# Patient Record
Sex: Female | Born: 1980 | Race: White | Hispanic: No | Marital: Married | Smoking: Never smoker
Health system: Southern US, Community
[De-identification: ages and names within clinical notes are randomized; demographics above are authoritative.]

## PROBLEM LIST (undated history)

## (undated) DIAGNOSIS — E538 Deficiency of other specified B group vitamins: Secondary | ICD-10-CM

## (undated) DIAGNOSIS — Z87448 Personal history of other diseases of urinary system: Secondary | ICD-10-CM

## (undated) DIAGNOSIS — E559 Vitamin D deficiency, unspecified: Secondary | ICD-10-CM

## (undated) DIAGNOSIS — R519 Headache, unspecified: Secondary | ICD-10-CM

## (undated) DIAGNOSIS — Z87442 Personal history of urinary calculi: Secondary | ICD-10-CM

## (undated) HISTORY — PX: WISDOM TOOTH EXTRACTION: SHX21

---

## 2003-07-17 ENCOUNTER — Inpatient Hospital Stay (HOSPITAL_COMMUNITY): Admission: AD | Admit: 2003-07-17 | Discharge: 2003-07-22 | Payer: Self-pay | Admitting: Obstetrics and Gynecology

## 2003-07-18 ENCOUNTER — Encounter (INDEPENDENT_AMBULATORY_CARE_PROVIDER_SITE_OTHER): Payer: Self-pay | Admitting: Specialist

## 2003-07-23 ENCOUNTER — Encounter: Admission: RE | Admit: 2003-07-23 | Discharge: 2003-08-22 | Payer: Self-pay | Admitting: Obstetrics and Gynecology

## 2008-02-08 ENCOUNTER — Inpatient Hospital Stay (HOSPITAL_COMMUNITY): Admission: AD | Admit: 2008-02-08 | Discharge: 2008-02-08 | Payer: Self-pay | Admitting: Obstetrics and Gynecology

## 2008-03-10 ENCOUNTER — Ambulatory Visit (HOSPITAL_COMMUNITY): Admission: RE | Admit: 2008-03-10 | Discharge: 2008-03-10 | Payer: Self-pay | Admitting: Obstetrics and Gynecology

## 2008-03-15 ENCOUNTER — Ambulatory Visit (HOSPITAL_COMMUNITY): Admission: RE | Admit: 2008-03-15 | Discharge: 2008-03-15 | Payer: Self-pay | Admitting: Obstetrics and Gynecology

## 2008-03-29 ENCOUNTER — Ambulatory Visit (HOSPITAL_COMMUNITY): Admission: RE | Admit: 2008-03-29 | Discharge: 2008-03-29 | Payer: Self-pay | Admitting: Obstetrics and Gynecology

## 2008-04-26 ENCOUNTER — Ambulatory Visit (HOSPITAL_COMMUNITY): Admission: RE | Admit: 2008-04-26 | Discharge: 2008-04-26 | Payer: Self-pay | Admitting: Obstetrics and Gynecology

## 2008-05-24 ENCOUNTER — Ambulatory Visit (HOSPITAL_COMMUNITY): Admission: RE | Admit: 2008-05-24 | Discharge: 2008-05-24 | Payer: Self-pay | Admitting: Obstetrics and Gynecology

## 2008-08-19 ENCOUNTER — Inpatient Hospital Stay (HOSPITAL_COMMUNITY): Admission: AD | Admit: 2008-08-19 | Discharge: 2008-08-19 | Payer: Self-pay | Admitting: Obstetrics and Gynecology

## 2008-09-16 ENCOUNTER — Inpatient Hospital Stay (HOSPITAL_COMMUNITY): Admission: RE | Admit: 2008-09-16 | Discharge: 2008-09-19 | Payer: Self-pay | Admitting: Obstetrics and Gynecology

## 2009-06-03 IMAGING — US US OB DETAIL+14 WK
1 series · 18 of 28 positions shown · non-contrast
Comparison: none

OBSTETRICAL ULTRASOUND:
 This ultrasound was performed in The [HOSPITAL], and the [facility] report will be stored to [REDACTED] PACS.

[Series 1: us ob detail+14 wk · 55 acquisitions, 18 frames shown]
[im 1/55]
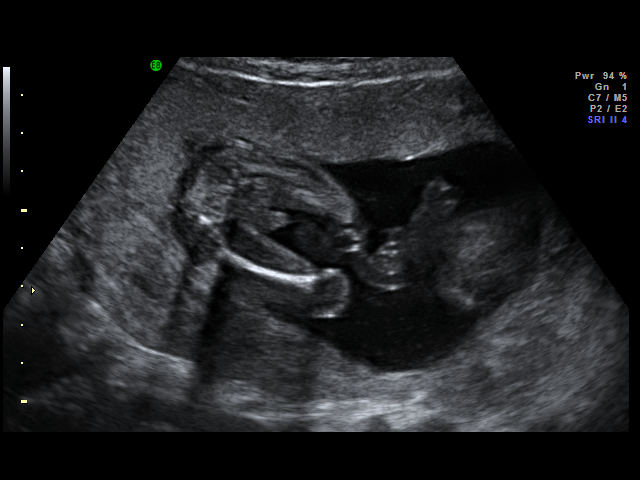
[im 5/55]
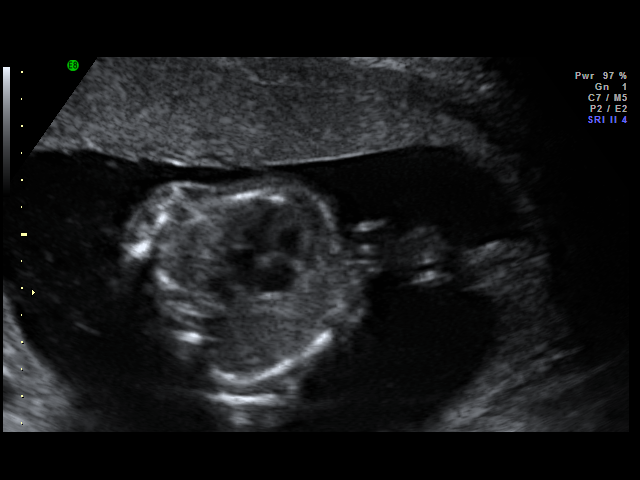
[im 7/55]
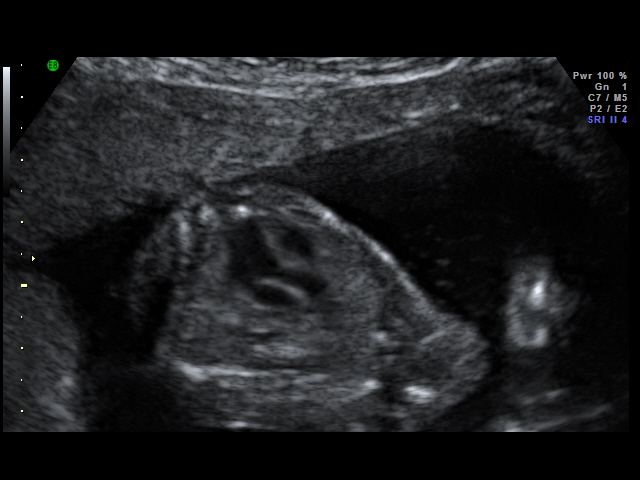
[im 11/55]
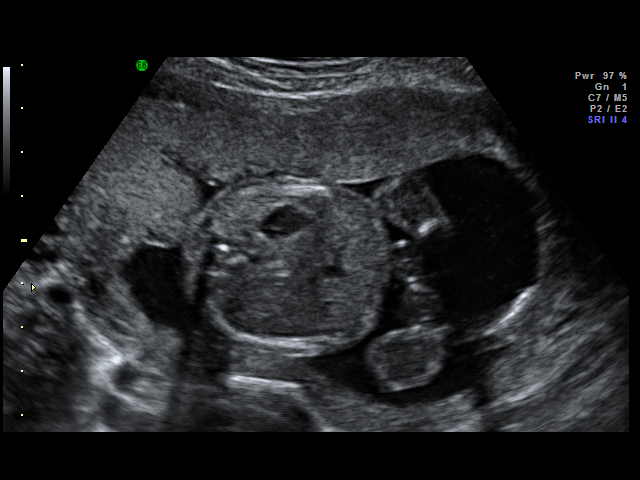
[im 15/55]
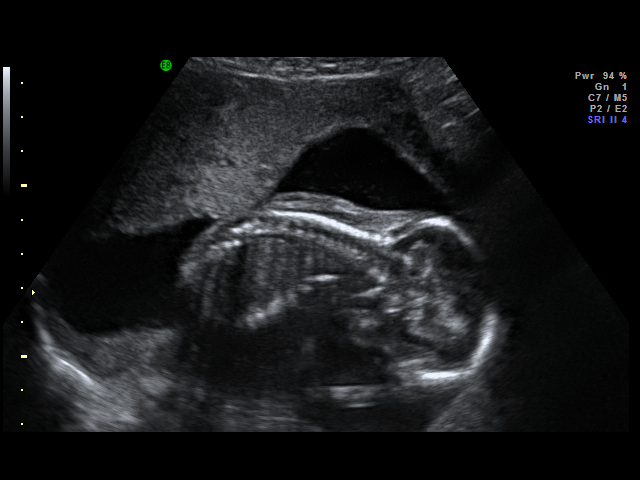
[im 17/55]
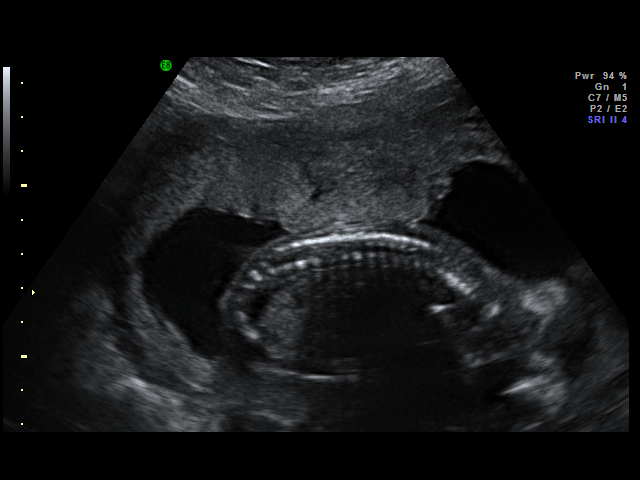
[im 21/55]
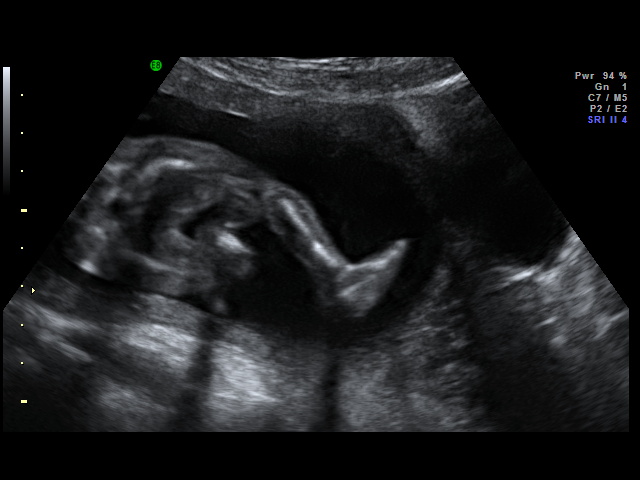
[im 23/55]
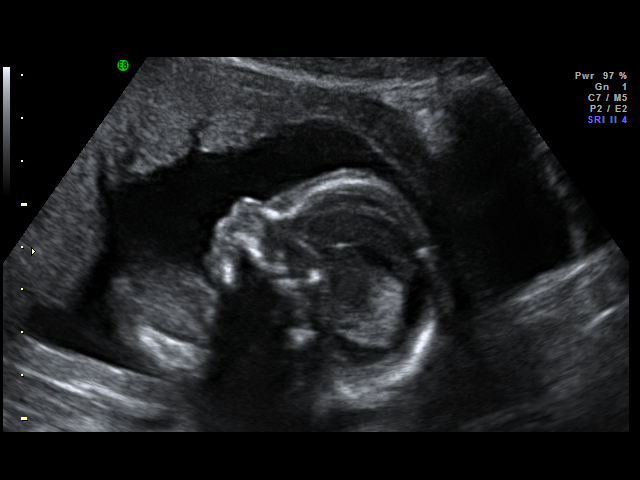
[im 27/55]
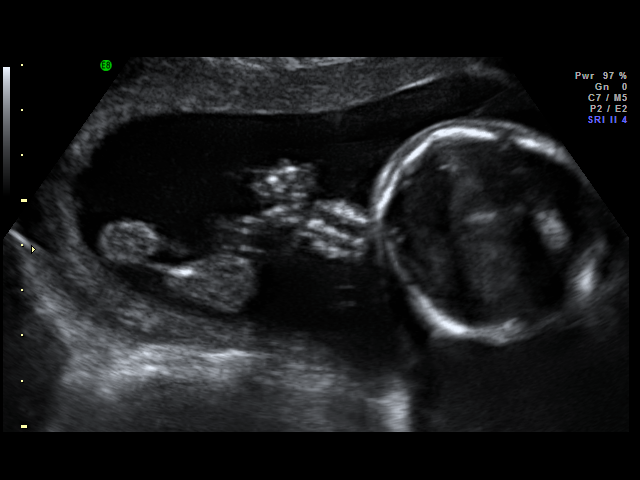
[im 29/55]
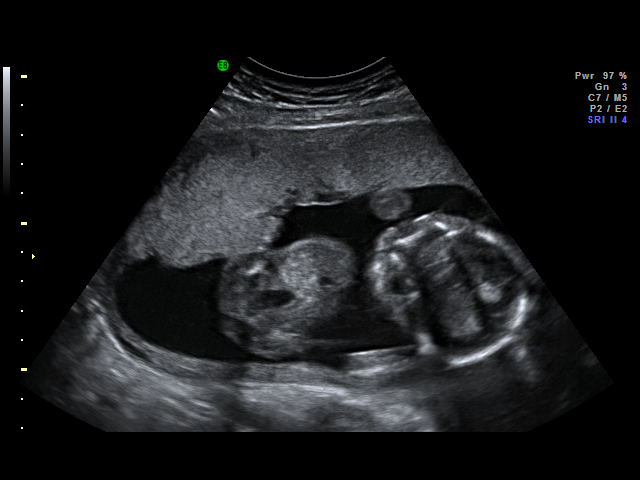
[im 33/55]
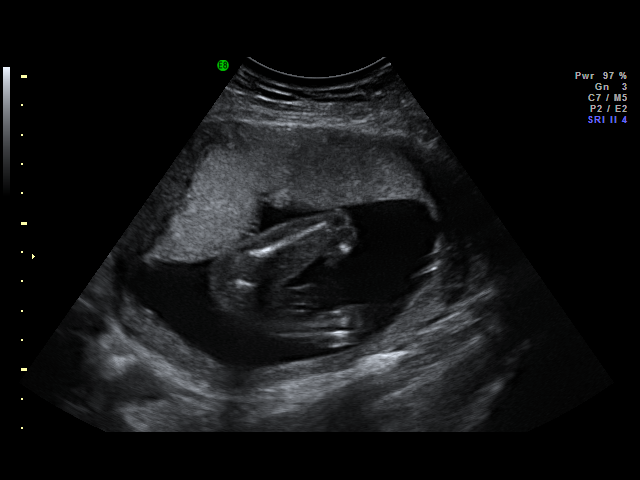
[im 35/55]
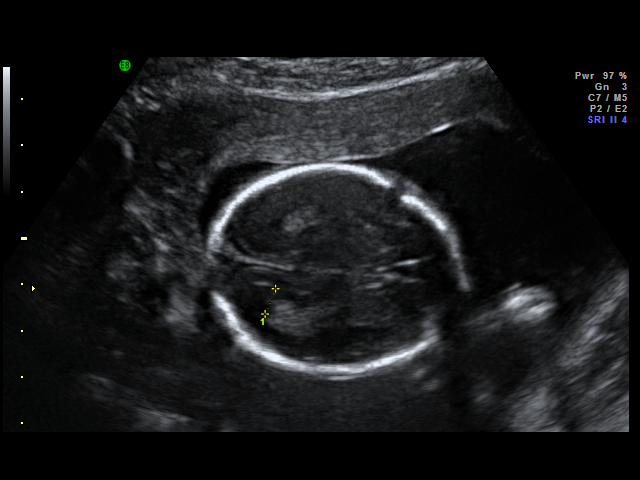
[im 39/55]
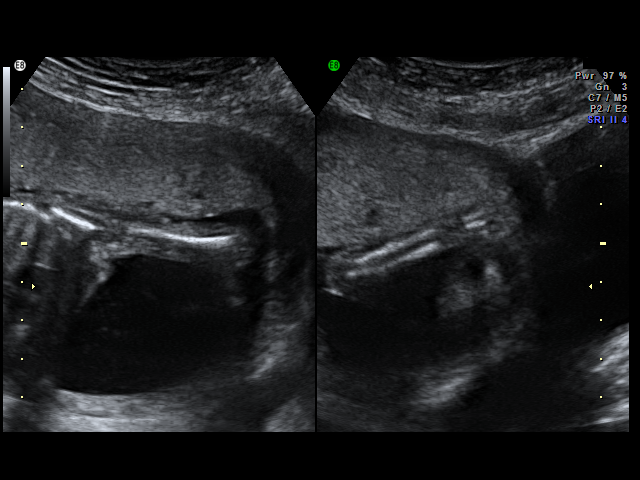
[im 43/55]
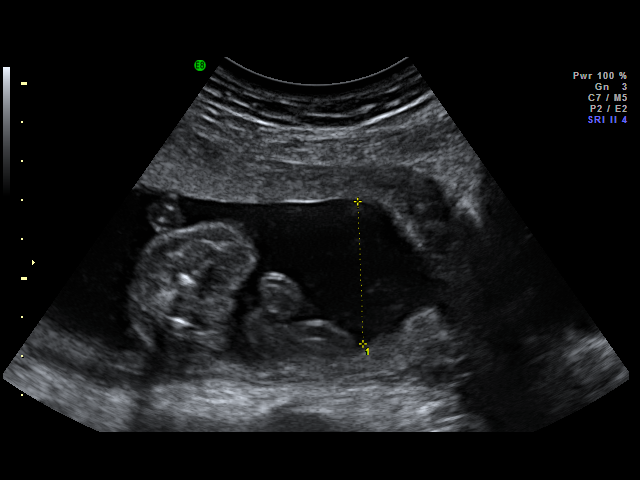
[im 45/55]
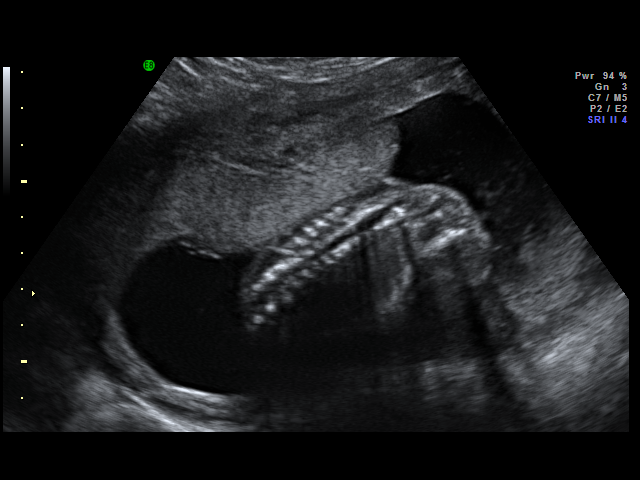
[im 49/55]
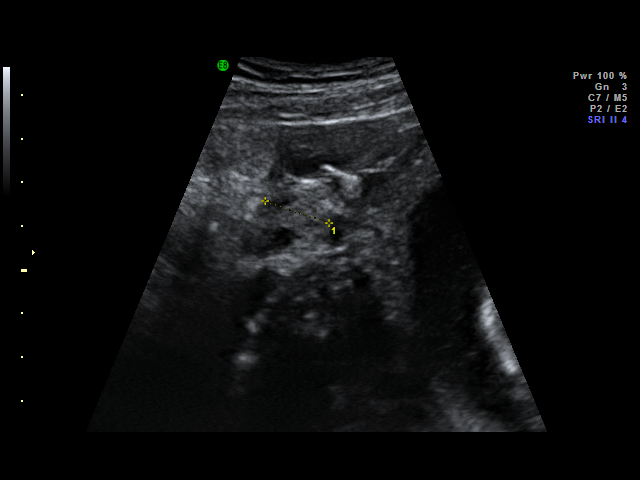
[im 51/55]
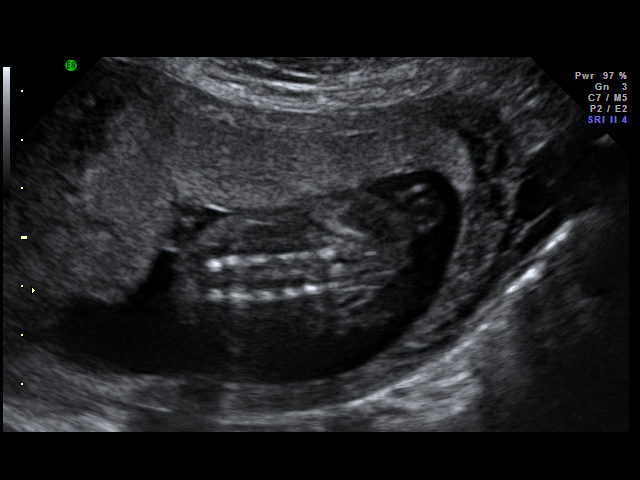
[im 55/55]
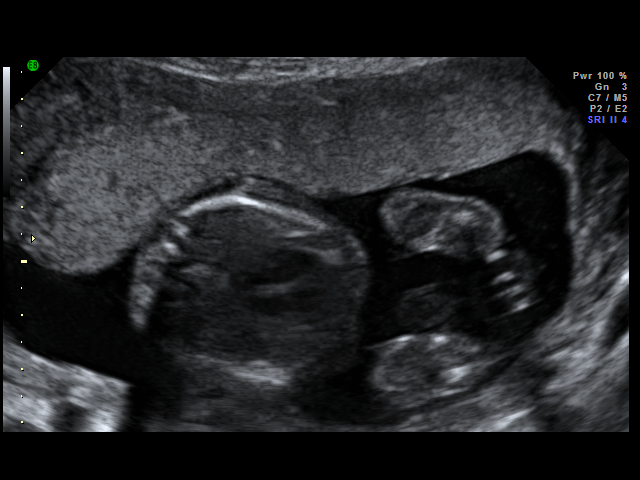

[18 of 28 positions shown; findings below may reference images not displayed]

IMPRESSION: [facility] has also been faxed to the ordering physician.

## 2010-05-27 ENCOUNTER — Encounter: Payer: Self-pay | Admitting: Obstetrics and Gynecology

## 2010-08-14 LAB — CBC
HCT: 26.6 % — ABNORMAL LOW (ref 36.0–46.0)
MCV: 93.3 fL (ref 78.0–100.0)
MCV: 94 fL (ref 78.0–100.0)
Platelets: 158 10*3/uL (ref 150–400)
Platelets: 208 10*3/uL (ref 150–400)
RBC: 2.83 MIL/uL — ABNORMAL LOW (ref 3.87–5.11)
RBC: 3.92 MIL/uL (ref 3.87–5.11)
WBC: 10.8 10*3/uL — ABNORMAL HIGH (ref 4.0–10.5)
WBC: 7.8 10*3/uL (ref 4.0–10.5)

## 2010-08-14 LAB — RPR: RPR Ser Ql: NONREACTIVE

## 2010-09-18 NOTE — Op Note (Signed)
NAMEJAHARI, [name]                  ACCOUNT NO.:  [id number]   MEDICAL RECORD NO.:  [id number]          PATIENT TYPE:  INP   LOCATION:  9143                          FACILITY:  [facility]   PHYSICIAN:  Huel Cote, M.D. DATE OF BIRTH:  1981-04-22   DATE OF PROCEDURE:  09/16/2008  DATE OF DISCHARGE:                               OPERATIVE REPORT   PREOPERATIVE DIAGNOSES:  1. Term pregnancy at 39 weeks delivered.  2. Prior cesarean section, declines trial of labor.   POSTOPERATIVE DIAGNOSES:  1. Term pregnancy at 39 weeks delivered.  2. Prior cesarean section, declines trial of labor.   PROCEDURE:  Repeat low transverse cesarean section.   SURGEON:  Huel Cote, MD   ASSISTANT:  Zenaida Niece, MD   ANESTHESIA:  Spinal.   FINDINGS:  This is a vigorous female infant in the vertex presentation.  Apgars were 8 and 9, weight was 7 pounds 13 ounces.  Normal uterus,  tubes, and ovaries were noted.   SPECIMEN:  Placenta was sent to L and D.   ESTIMATED BLOOD LOSS:  800 mL.   URINE OUTPUT:  100 mL clear urine.   IV FLUIDS:  2400 mL LR.   PROCEDURE:  The patient was taken to the operating room, where spinal  anesthesia was obtained without difficulty.  She was then prepped and  draped in the normal sterile fashion in the dorsal supine position with  a leftward tilt with a Foley catheter in place.  A Pfannenstiel skin  incision was then made through a preexisting scar and carried through to  the underlying layer of fascia by sharp dissection and Bovie cautery.  Fascia was then nicked in the midline and the incision was extended  laterally with Mayo scissors.  The inferior aspect was grasped with  Kocher clamps, elevated, and dissected off the underlying rectus  muscles.  Superior aspect was likewise dissected off the rectus muscles.  These were separated in the midline and the peritoneum entered bluntly.  This incision was then extended both superiorly and inferiorly with  careful attention to avoid both bowel and bladder.  The Alexis wound  retractor was then placed within the incision and the lower uterine  segment exposed nicely.  This was then incised in a transverse fashion  and the cavity itself entered bluntly with clear fluid noted.  The  infant's head was then delivered atraumatically.  The nose and mouth  were bulb suctioned and the remainder of the infant delivered without  difficulty.  Cord was clamped and cut and the infant was handed to the  awaiting pediatricians.  The cord blood was then obtained and the  placenta delivered manually from the uterus which was cleared of all  clots and debris with moist lap sponge.  The uterus was then closed in 2  layers, the first with running lock layer of 1-0 chromic, the second an  imbricating layer of same suture.  The incision appeared hemostatic.  Tubes and ovaries were inspected and found to be normal.  The gutters  were cleared of all clots and debris.  Again the incision was inspected  and found to be hemostatic.  The peritoneum and rectus muscles were then  reapproximated with several interrupted sutures of 0 Vicryl in mattress  sutures.  The fascia was then closed in a running fashion with 0 Vicryl  and the skin was closed with staples.  Sponge, lap, and needle counts  were correct x2, and the patient was taken to the recovery room in  stable condition.      Huel Cote, M.D.  Electronically Signed     KR/MEDQ  D:  09/16/2008  T:  09/17/2008  Job:  [id number]

## 2010-09-18 NOTE — H&P (Signed)
[name], [name]                  ACCOUNT NO.:  [id number]   MEDICAL RECORD NO.:  [id number]          PATIENT TYPE:  INP   LOCATION:                                [facility]  [facility]   PHYSICIAN:  Huel Cote, M.D. DATE OF BIRTH:  1981/04/08   DATE OF ADMISSION:  09/16/2008  DATE OF DISCHARGE:                              HISTORY & PHYSICAL   The patient is a 30 year old G2, P0-1-0-1, who is coming in for a  scheduled repeat C-section at 39 weeks, given a prior C-section and a  decline in trial of labor.  The patient's prenatal care had been  significant for a very early finding of possible cystic hygroma and  developing fetal hydrops for which she was extensively followed up by  Maternal Fetal Medicine.  The hydrops actually resolved and the patient  was tested for several possible viral illnesses.  She did have an  equivocal coxsackie virus check, which could have been responsible for  the early nonimmune hydrops, which did resolve in the baby.  The baby  was followed by subsequent frequent scanning and growth scanning, and  the baby appeared to develop normally and had a normal fetal  echocardiogram as well.  Due to consistent normal growth, her growth  ultrasounds were discontinued after 32 weeks and plan was made for  delivery via repeat C-section.  Other than this rather complicated  prenatal course, the patient is a healthy female and had only the  previous C-section as a risk factor for the pregnancy.   PRENATAL LABORATORY DATA:  O positive, antibody negative, RPR  nonreactive, rubella immune, hepatitis B surface antigen negative, HIV  negative, GC negative, and Chlamydia negative.  First trimester screen  did overall return negative for chromosomal defects, even though the  ultrasound was initially abnormal.  One-hour Glucola is 117.  TORCH  titers performed were negative, except for the equivocal coxsackie virus  titer.  Group B strep was negative.   PAST OBSTETRICAL  HISTORY:  In 2005, the patient had a primary C-section  for a breech baby at 35 plus weeks measuring 6 pounds 13 ounces.   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  She otherwise is  healthy female.   PAST SURGICAL HISTORY:  History of wisdom teeth removal only.   PAST GYN HISTORY:  No abnormal Pap smears.   ALLERGIES:  None known.   MEDICATIONS:  None except for prenatal vitamins.   PHYSICAL EXAMINATION:  VITAL SIGNS:  On admission, the patient's weight  is 157 pounds and blood pressure 100/60.  CARDIAC:  Regular rate and rhythm.  LUNGS:  Clear.  ABDOMEN:  Soft, gravid, and nontender.  Cervix on last check was 51 plus  and -2 station.   It was discussed with the patient the risks and benefits of cesarean  section including bleeding, infection, and possible damage to bowel and  bladder.  The patient understands these risks and desired to proceed  with cesarean section and forego any trial of labor.  We have scheduled  her for repeat C-section at the [facility] facility on Sep 16, 2008, and we will proceed with that plan, unless she labors prior.      Huel Cote, M.D.  Electronically Signed     KR/MEDQ  D:  09/14/2008  T:  09/15/2008  Job:  [id number]

## 2010-09-18 NOTE — Discharge Summary (Signed)
NAMEYAMELI, [name]                  ACCOUNT NO.:  [id number]   MEDICAL RECORD NO.:  [id number]          PATIENT TYPE:  INP   LOCATION:  9143                          [facility]   PHYSICIAN:  Malachi Pro. Ambrose Mantle, M.D. DATE OF BIRTH:  1980-11-28   DATE OF ADMISSION:  09/16/2008  DATE OF DISCHARGE:  09/18/2008                               DISCHARGE SUMMARY   A 30 year old white female para 0-1-0-1, gravida 2 scheduled for a  repeat C-section.  The patient's prenatal care had been significant for  very early finding of possible cystic hygroma and developing fetal  hydrops.  She was evaluated by Maternal Fetal Medicine.  The hydrops  resolved.  The patient was tested for several viral illnesses.  She had  an equivocal coxsackievirus check.  The baby was followed by frequent  scanning and growth scanning.  The baby appeared to develop normally,  had a normal fetal echocardiogram.  The patient's blood group and type  was O positive with a negative antibody, RPR nonreactive, rubella  immune, hepatitis B surface antigen negative, HIV negative, GC and  chlamydia negative.  First trimester screen was negative for chromosomal  defects, even though the ultrasound was initially abnormal.  One-hour  Glucola was 117.  TORCH titers were negative except for the equivocal  coxsackievirus titer.  Group B strep negative.  The patient was  admitted, had a repeat C-section by Dr. Senaida Ores, at which time she  found a normal uterus, tubes, and ovaries.  She delivered a vigorous  female infant, 7 pounds 13 ounces with Apgars of 8 at 1-minute and 9 at 5  minutes.  Postoperatively, the patient did well.  Initially, she had no  urine output and the Foley catheter was changed the catheter and she had  1650 mL of urine in her bladder.  Subsequently, did quite well and was  discharged on the second postoperative day.  Initial hemoglobin was  12/06, hematocrit 36.6, white count 7,800, and platelet count 208,000.  RPR nonreactive.  Followup hemoglobin 9.3 and hematocrit 26.6.  At the  present time, the patient is ambulating well, tolerating a regular diet,  voiding well, and passing flatus.  Her incision looks clean and dry and  she is ready for discharge.   FINAL DIAGNOSIS:  Intrauterine pregnancy at 39 weeks' delivered vertex  by C-section.   OPERATION:  Low-transverse cervical C-section.   FINAL CONDITION:  Improved.   INSTRUCTIONS:  Our regular discharge instruction booklet.  The patient  was given prescriptions for Motrin 600 mg 30 tablets 1 every 6 hours as  needed for pain and Darvocet-N 100 12 tablets 1 every 4-6 hours as  needed for pain.  She is to return to the office in 2-3 days to have her  staples removed.      Malachi Pro. Ambrose Mantle, M.D.  Electronically Signed     TFH/MEDQ  D:  09/18/2008  T:  09/18/2008  Job:  [id number]

## 2010-09-21 NOTE — H&P (Signed)
Tamara, [name]                              ACCOUNT NO.:  [id number]   MEDICAL RECORD NO.:  [id number]                   PATIENT TYPE:  INP   LOCATION:  9124                                 [facility]   PHYSICIAN:  Malachi Pro. Ambrose Mantle, M.D.              DATE OF BIRTH:  1980-05-20   DATE OF ADMISSION:  07/17/2003  DATE OF DISCHARGE:                                HISTORY & PHYSICAL   HISTORY OF PRESENT ILLNESS:  A 30 year old white married female para 0  gravida 1.  Last period November 04, 2002.  [facility] August 12, 2003 by dates and August 14, 2003 by ultrasound.  Admitted in labor with rupture of membranes and  frank breech presentation.  Blood group and type O positive with a negative  antibody, nonreactive serology, rubella immune, hepatitis B surface antigen  negative, HIV negative, GC and chlamydia negative, triple screen normal,  group B strep positive, one-hour Glucola 94.  The patient had vaginal  ultrasound on January 07, 2003:  Crown-rump length 2.05 cm; 8 weeks 5 days;  [facility] August 14, 2003.  Repeat ultrasound on March 16, 2003:  Average  gestational age [redacted] weeks 4 days; [facility] August 13, 2003.  Prenatal care was  uncomplicated except breech presentation.  At 10:15 p.m. on July 17, 2003  the patient had spontaneous rupture of membranes and 30 minutes later had  onset of contractions.  On admission the cervix was 4 cm dilated; frank  breech presentation was confirmed by ultrasound.   PAST MEDICAL HISTORY:  Allergies to CODEINE - caused vomiting.  No  operations.  Illnesses:  Cystitis.   FAMILY HISTORY:  Mother with high blood pressure, anxiety, and diabetes and  thyroid dysfunction.  Maternal grandfather with MI, high blood pressure,  CVA.  Paternal grandfather - MI.  Maternal uncle - Downs syndrome.   PHYSICAL EXAMINATION ON ADMISSION:  VITAL SIGNS:  Blood pressure 127/75,  temperature 98.2, pulse 107, respirations 18.  HEART:  Normal size and sounds, no murmurs.  LUNGS:  Clear  to P&A.  ABDOMEN:  Soft, nontender, near term-sized fundus.  Fetal heart tones  normal, no decelerations, adequate variability.  PELVIC:  Cervix 4 cm per the admitting nurse.   IMPRESSION:  1. Intrauterine pregnancy at 36-and-a-half weeks.  2. Rupture of membranes.  3. Breech presentation.   The patient is prepared for cesarean section.                                               Malachi Pro. Ambrose Mantle, M.D.    TFH/MEDQ  D:  07/18/2003  T:  07/18/2003  Job:  [id number]

## 2010-09-21 NOTE — Discharge Summary (Signed)
NAMELASHAY, OSBORNE                              ACCOUNT NO.:  [id number]   MEDICAL RECORD NO.:  [id number]                   PATIENT TYPE:  INP   LOCATION:  9124                                 [facility]   PHYSICIAN:  Malachi Pro. Ambrose Mantle, M.D.              DATE OF BIRTH:  09-06-1980   DATE OF ADMISSION:  07/17/2003  DATE OF DISCHARGE:  07/22/2003                                 DISCHARGE SUMMARY   HOSPITAL COURSE:  A 30 year old white female para 0 gravida 1; EDC July 12, 2003; admitted in labor with rupture of membranes and frank breech  presentation.  Blood group and type O positive with a negative antibody,  nonreactive serology, rubella immune, hepatitis B surface antigen negative,  HIV negative, GC and chlamydia negative, triple screen normal, group B strep  positive, one-hour Glucola 94.  The patient was admitted in labor with a  breech presentation.  She was taken to the operating room, underwent a low  transverse cervical C-section by Dr. Ambrose Mantle under spinal anesthesia with the  delivery of a female infant, 6 pounds 13 ounces, Apgars of 5 at one and 9 at  five minutes, arterial cord pH was 7.24.  Postpartum the patient did  extremely well.  I felt that she was a candidate for discharge on postpartum  day #2 but because of the baby's slightly poor feeding she wanted to stay  and she was kept until postoperative day #4 at which time the staples were  removed, strips were applied, and she was discharged.  She was ambulating  well without difficulty, tolerating a regular diet, voiding well without  difficulty, and had had a bowel movement.   LABORATORY DATA:  On admission, hemoglobin 10.6; hematocrit 31.5; white  count 8600; platelet count 287,000.  Follow-up hemoglobin 9.5.  RPR  nonreactive.   FINAL DIAGNOSIS:  Intrauterine pregnancy at 36-and-a-half weeks with  ruptured membranes and frank breech presentation, delivered by cesarean  section.   OPERATION:  Low transverse  cervical cesarean section.   FINAL CONDITION:  Improved.   INSTRUCTIONS:  Instructions include our regular discharge instruction  booklet.  The patient is given a prescription for Darvocet-N 100 #20 tablets  one q.4-6h. as needed for pain and is advised to return to the office in 10-  14 days for follow-up examination.                                               Malachi Pro. Ambrose Mantle, M.D.    TFH/MEDQ  D:  07/22/2003  T:  07/23/2003  Job:  [id number]

## 2010-09-21 NOTE — Op Note (Signed)
[name], [name]                              ACCOUNT NO.:  [id number]   MEDICAL RECORD NO.:  [id number]                   PATIENT TYPE:  INP   LOCATION:  9124                                 [facility]   PHYSICIAN:  Malachi Pro. Ambrose Mantle, M.D.              DATE OF BIRTH:  [birth year]   DATE OF PROCEDURE:  07/18/2003  DATE OF DISCHARGE:                                 OPERATIVE REPORT   PREOPERATIVE DIAGNOSES:  1. Intrauterine pregnancy at 36+ weeks.  2. Active labor.  3. Positive group B Streptococcus.  4. Breech presentation.   POSTOPERATIVE DIAGNOSES:  1. Intrauterine pregnancy at 36+ weeks.  2. Active labor.  3. Positive group B Streptococcus.  4. Breech presentation.   OPERATION:  Low transverse cervical C-section.  Operator:  Malachi Pro. Ambrose Mantle,  M.D.  Spinal anesthesia.  Female infant, 6 pounds 13 ounces, Apgars of 5 at  one and 9 at five minutes.  Arterial cord pH 7.24.  Blood loss about 1000  mL.   The patient was brought to the operating room and given a spinal anesthetic  by Dr. Tacy Dura.  She was placed in the left lateral tilt position.  Fetal  heart tones were confirmed normal.  The abdomen and urethra were prepped  with Betadine solution and Foley catheter was inserted to straight drain.  The abdomen was then draped as a sterile field.  Adequate anesthesia was  confirmed.  A transverse incision was made and carried in layers through the  skin and subcutaneous tissue and fascia.  The fascia was then separated from  the rectus muscles superiorly and inferiorly.  The rectus muscle was split  in the midline.  The peritoneum was opened vertically.  The lower uterine  segment was inspected, found to be well developed.  An incision was made  into the lower uterine segment peritoneum and extended laterally and pushed  inferiorly, pulling the bladder with it.  An incision was then made into the  lower uterine segment, initially going through the superficial myometrium  with  the knife and then with my finger into the amniotic sac where we  obtained clear fluid along with blood.  I then enlarged the incision by  pulling superiorly and inferiorly.  The baby was frank breech.  The baby's  buttocks delivered easily, as did the body.  I delivered both arms without  difficulty.  The initial part of the baby's head delivered all right.  Then,  using the Mariceau-Smellie-Beit maneuver I got the baby to deliver to the  forehead, but then it seemed to have difficulty coming all out, so I asked  the assistant to hold the baby's legs up and I was able to reach under the  baby's head and lift the baby out.  This process probably took close to a  minute.  In the meantime, the baby lost a little bit of tone.  I clamped the  cord, gave the infant to Dr. Ruben Gottron who was in attendance.  He  assigned the baby Apgars of 5 at one minute and 9 at five minutes.  I did an  arterial cord pH that was 7.24.  The placenta was removed.  The inside of  the uterus was found to be without problems.  The tubes and ovaries and  uterus appeared normal.  The uterus was closed with 2 layers using running  locked suture of 0 Vicryl in the first layer, nonlocking suture of the same  material on the second layer.  A couple of sutures of 3-0 Vicryl were  required to complete hemostasis as well as some use of the electrical  current.  Liberal irrigation confirmed hemostasis at this point.  The rectus  muscle was reapproximated along with some of the peritoneum with interrupted  sutures of 0 Vicryl.  The fascia was then closed with two running sutures of  0 Vicryl, subcu with a running 3-0 Vicryl, and the skin was closed with  automatic staples.  The  patient seemed to tolerated the procedure well.  Blood loss was estimated by  the nurse anesthetist at 500 mL; I felt like maybe it was 1000 mL.  At any  rate, it was not excessive.  Sponge and needle counts were correct and the  patient was returned  to recovery in satisfactory condition.                                               Malachi Pro. Ambrose Mantle, M.D.    TFH/MEDQ  D:  07/18/2003  T:  07/18/2003  Job:  [id number]

## 2011-02-04 LAB — URINALYSIS, ROUTINE W REFLEX MICROSCOPIC
Glucose, UA: NEGATIVE
Hgb urine dipstick: NEGATIVE
Nitrite: NEGATIVE
Protein, ur: NEGATIVE
Urobilinogen, UA: >8 — ABNORMAL HIGH
pH: 6.5

## 2011-02-04 LAB — URINE MICROSCOPIC-ADD ON

## 2011-02-05 LAB — TORCH TITERS-IGG(TOXO/ RUB/ CMV/ HSV)
HSV I/II IgG: 0.15 IV
Rubella IgG Scr: 22 IU/mL
Toxoplasma IgG Antibody (EIA): <5 IU/mL

## 2011-02-05 LAB — COXSACKIE B VIRUS ANTIBODIES
Coxsackie B1 Ab: 1:8 {titer} — ABNORMAL HIGH
Coxsackie B2 Ab: <1:8 {titer}
Coxsackie B4 Ab: <1:8 {titer}

## 2014-04-03 ENCOUNTER — Emergency Department (INDEPENDENT_AMBULATORY_CARE_PROVIDER_SITE_OTHER)
Admission: EM | Admit: 2014-04-03 | Discharge: 2014-04-03 | Disposition: A | Discharge status: Home / Self Care | Payer: 59 | Source: Home / Self Care | Attending: Family Medicine | Admitting: Family Medicine

## 2014-04-03 DIAGNOSIS — J0100 Acute maxillary sinusitis, unspecified: Secondary | ICD-10-CM

## 2014-04-03 MED ORDER — PREDNISONE 10 MG PO TABS: 30.0000 mg | ORAL_TABLET | Freq: Every day | ORAL | Status: DC

## 2014-04-03 MED ORDER — IPRATROPIUM BROMIDE 0.06 % NA SOLN: 2.0000 | Freq: Four times a day (QID) | NASAL | Status: DC

## 2014-04-03 MED ORDER — AZITHROMYCIN 250 MG PO TABS: 250.0000 mg | ORAL_TABLET | Freq: Every day | ORAL | Status: DC

## 2014-04-03 NOTE — Discharge Instructions (Signed)
Thank you for coming in today. Call or go to the emergency room if you get worse, have trouble breathing, have chest pains, or palpitations.  Take azithromycin antibiotics if not getting better  Sinusitis Sinusitis is redness, soreness, and inflammation of the paranasal sinuses. Paranasal sinuses are air pockets within the bones of your face (beneath the eyes, the middle of the forehead, or above the eyes). In healthy paranasal sinuses, mucus is able to drain out, and air is able to circulate through them by way of your nose. However, when your paranasal sinuses are inflamed, mucus and air can become trapped. This can allow bacteria and other germs to grow and cause infection. Sinusitis can develop quickly and last only a short time (acute) or continue over a long period (chronic). Sinusitis that lasts for more than 12 weeks is considered chronic.  CAUSES  Causes of sinusitis include:  Allergies.  Structural abnormalities, such as displacement of the cartilage that separates your nostrils (deviated septum), which can decrease the air flow through your nose and sinuses and affect sinus drainage.  Functional abnormalities, such as when the small hairs (cilia) that line your sinuses and help remove mucus do not work properly or are not present. SIGNS AND SYMPTOMS  Symptoms of acute and chronic sinusitis are the same. The primary symptoms are pain and pressure around the affected sinuses. Other symptoms include:  Upper toothache.  Earache.  Headache.  Bad breath.  Decreased sense of smell and taste.  A cough, which worsens when you are lying flat.  Fatigue.  Fever.  Thick drainage from your nose, which often is green and may contain pus (purulent).  Swelling and warmth over the affected sinuses. DIAGNOSIS  Your health care provider will perform a physical exam. During the exam, your health care provider may:  Look in your nose for signs of abnormal growths in your nostrils (nasal  polyps).  Tap over the affected sinus to check for signs of infection.  View the inside of your sinuses (endoscopy) using an imaging device that has a light attached (endoscope). If your health care provider suspects that you have chronic sinusitis, one or more of the following tests may be recommended:  Allergy tests.  Nasal culture. A sample of mucus is taken from your nose, sent to a lab, and screened for bacteria.  Nasal cytology. A sample of mucus is taken from your nose and examined by your health care provider to determine if your sinusitis is related to an allergy. TREATMENT  Most cases of acute sinusitis are related to a viral infection and will resolve on their own within 10 days. Sometimes medicines are prescribed to help relieve symptoms (pain medicine, decongestants, nasal steroid sprays, or saline sprays).  However, for sinusitis related to a bacterial infection, your health care provider will prescribe antibiotic medicines. These are medicines that will help kill the bacteria causing the infection.  Rarely, sinusitis is caused by a fungal infection. In theses cases, your health care provider will prescribe antifungal medicine. For some cases of chronic sinusitis, surgery is needed. Generally, these are cases in which sinusitis recurs more than 3 times per year, despite other treatments. HOME CARE INSTRUCTIONS   Drink plenty of water. Water helps thin the mucus so your sinuses can drain more easily.  Use a humidifier.  Inhale steam 3 to 4 times a day (for example, sit in the bathroom with the shower running).  Apply a warm, moist washcloth to your face 3 to 4 times  a day, or as directed by your health care provider.  Use saline nasal sprays to help moisten and clean your sinuses.  Take medicines only as directed by your health care provider.  If you were prescribed either an antibiotic or antifungal medicine, finish it all even if you start to feel better. SEEK IMMEDIATE  MEDICAL CARE IF:  You have increasing pain or severe headaches.  You have nausea, vomiting, or drowsiness.  You have swelling around your face.  You have vision problems.  You have a stiff neck.  You have difficulty breathing. MAKE SURE YOU:   Understand these instructions.  Will watch your condition.  Will get help right away if you are not doing well or get worse. Document Released: 04/22/2005 Document Revised: 09/06/2013 Document Reviewed: 05/07/2011 [facility] Patient Information 2015 [employer], [place]. This information is not intended to replace advice given to you by your health care provider. Make sure you discuss any questions you have with your health care provider.

## 2014-04-03 NOTE — ED Provider Notes (Signed)
[name] is a 33 y.o. female who presents to Urgent Care today for nasal congestion and cough and nasal discharge. Symptoms present for around a month. She was treated with amoxicillin recently which did not help much. Her son is also sick. No vomiting or diarrhea. No trouble breathing. No chest pain or palpitations or shortness of breath.    No past medical history on file. Past Surgical History  Procedure Laterality Date  . Cesarean section  2005, 2010   History  Substance Use Topics  . Smoking status: Never Smoker   . Smokeless tobacco: Not on file  . Alcohol Use: No   ROS as above Medications: No current facility-administered medications for this encounter.   Current Outpatient Prescriptions  Medication Sig Dispense Refill  . levonorgestrel (MIRENA) 20 MCG/24HR IUD 1 each by Intrauterine route once.    Marland Kitchen. azithromycin (ZITHROMAX) 250 MG tablet Take 1 tablet (250 mg total) by mouth daily. Take first 2 tablets together, then 1 every day until finished. 6 tablet 0  . ipratropium (ATROVENT) 0.06 % nasal spray Place 2 sprays into both nostrils 4 (four) times daily. 15 mL 1  . predniSONE (DELTASONE) 10 MG tablet Take 3 tablets (30 mg total) by mouth daily. 15 tablet 0   Allergies  Allergen Reactions  . Codeine      Exam:  BP 100/66 mmHg  Pulse 64  Temp(Src) 97.9 F (36.6 C) (Oral)  Ht 5\' 1"  (1.549 m)  Wt 129 lb (58.514 kg)  BMI 24.39 kg/m2  SpO2 99% Gen: Well NAD HEENT: EOMI,  MMM nontender bilateral maxillary sinuses. Normal posterior pharynx and tympanic membranes. Lungs: Normal work of breathing. CTABL Heart: RRR no MRG Abd: NABS, Soft. Nondistended, Nontender Exts: Brisk capillary refill, warm and well perfused.   No results found for this or any previous visit (from the past 24 hour(s)). No results found.  Assessment and Plan: 33 y.o. female with viral sinusitis. Treat with prednisone and Atrovent nasal spray. Uses azithromycin if not better. Follow-up with  primary care provider.  Discussed warning signs or symptoms. Please see discharge instructions. Patient expresses understanding.     Rodolph BongEvan S Corey, MD 04/03/14 [phone]

## 2014-04-03 NOTE — ED Notes (Signed)
States 2 weeks ago she was given Amoxicillin which helped but symptoms has return.

## 2014-12-18 ENCOUNTER — Emergency Department (INDEPENDENT_AMBULATORY_CARE_PROVIDER_SITE_OTHER)
Admission: EM | Admit: 2014-12-18 | Discharge: 2014-12-18 | Disposition: A | Discharge status: Home / Self Care | Source: Home / Self Care | Attending: Family Medicine | Admitting: Family Medicine

## 2014-12-18 ENCOUNTER — Encounter: Payer: Self-pay | Admitting: *Deleted

## 2014-12-18 DIAGNOSIS — J0100 Acute maxillary sinusitis, unspecified: Secondary | ICD-10-CM

## 2014-12-18 MED ORDER — AMOXICILLIN-POT CLAVULANATE 875-125 MG PO TABS: 1.0000 | ORAL_TABLET | Freq: Two times a day (BID) | ORAL | Status: DC

## 2014-12-18 MED ORDER — SALINE SPRAY 0.65 % NA SOLN: 1.0000 | NASAL | Status: DC | PRN

## 2014-12-18 MED ORDER — DM-GUAIFENESIN ER 30-600 MG PO TB12: 1.0000 | ORAL_TABLET | Freq: Two times a day (BID) | ORAL | Status: DC

## 2014-12-18 NOTE — ED Notes (Signed)
Pt c/o productive cough, scratchy throat and bilateral ear pressure x 2 wks. She has taken tylenol and Benadryl.

## 2014-12-18 NOTE — Discharge Instructions (Signed)
Please take antibiotics as prescribed and be sure to complete entire course even if you start to feel better to ensure infection does not come back. ° ° °You may take 400-600mg Ibuprofen (Motrin) every 6-8 hours for fever and pain  °Alternate with Tylenol  °You may take 500mg Tylenol every 4-6 hours as needed for fever and pain  °Follow-up with your primary care provider next week for recheck of symptoms if not improving.  °Be sure to drink plenty of fluids and rest, at least 8hrs of sleep a night, preferably more while you are sick. °Return urgent care or go to closest ER if you cannot keep down fluids/signs of dehydration, fever not reducing with Tylenol, difficulty breathing/wheezing, stiff neck, worsening condition, or other concerns (see below)  ° °

## 2014-12-18 NOTE — ED Provider Notes (Signed)
CSN: [id number]     Arrival date & time 12/18/14  1101 History   First MD Initiated Contact with Patient 12/18/14 1120     Chief Complaint  Patient presents with  . Cough   (Consider location/radiation/quality/duration/timing/severity/associated sxs/prior Treatment) HPI The patient is a 34 year old female presenting to urgent care with complaints of gradually worsening URI symptoms including nasal congestion, bilateral ear pain and pressure as well as a mild intermittent productive cough with green mucus.  Symptoms started about 2 weeks ago.  Patient also complained of diffuse body aches, generalized fatigue and subjective fever.  She has tried Tylenol and Benadryl with minimal relief.  Patient states that her husband as well as her daughter have been sick for about the same time.  Denies recent travel.  Denies known tick bites.  Denies rashes.  Denies history of asthma.  History reviewed. No pertinent past medical history. Past Surgical History  Procedure Laterality Date  . Cesarean section  2005, 2010  . Wisdom tooth extraction     Family History  Problem Relation Age of Onset  . Diabetes Mother   . Hypertension Mother   . Thyroid disease Mother   . Diabetes Father   . Hypertension Father   . Emphysema Father    Social History  Substance Use Topics  . Smoking status: Never Smoker   . Smokeless tobacco: None  . Alcohol Use: No   OB History    No data available     Review of Systems  Constitutional: Positive for fever, chills and fatigue.  HENT: Positive for congestion, ear pain ( bilateral), rhinorrhea, sinus pressure and sore throat. Negative for sneezing, trouble swallowing and voice change.   Respiratory: Positive for cough. Negative for shortness of breath.   Cardiovascular: Negative for chest pain and palpitations.  Gastrointestinal: Negative for nausea, vomiting, abdominal pain and diarrhea.  Musculoskeletal: Positive for myalgias and arthralgias. Negative for back  pain.  Skin: Negative for rash.  All other systems reviewed and are negative.   Allergies  Codeine  Home Medications   Prior to Admission medications   Medication Sig Start Date End Date Taking? Authorizing Provider  amoxicillin-clavulanate (AUGMENTIN) 875-125 MG per tablet Take 1 tablet by mouth 2 (two) times daily. One po bid x 7 days 12/18/14   Junius Finner, PA-C  azithromycin (ZITHROMAX) 250 MG tablet Take 1 tablet (250 mg total) by mouth daily. Take first 2 tablets together, then 1 every day until finished. 04/03/14   Rodolph Bong, MD  dextromethorphan-guaiFENesin [facility] DM) 30-600 MG per 12 hr tablet Take 1 tablet by mouth 2 (two) times daily. Take with large glass of water 12/18/14   Junius Finner, PA-C  ipratropium (ATROVENT) 0.06 % nasal spray Place 2 sprays into both nostrils 4 (four) times daily. 04/03/14   Rodolph Bong, MD  levonorgestrel (MIRENA) 20 MCG/24HR IUD 1 each by Intrauterine route once.    Historical Provider, MD  predniSONE (DELTASONE) 10 MG tablet Take 3 tablets (30 mg total) by mouth daily. 04/03/14   Rodolph Bong, MD  sodium chloride (OCEAN) 0.65 % SOLN nasal spray Place 1 spray into both nostrils as needed. 12/18/14   Junius Finner, PA-C   BP 101/66 mmHg  Pulse 77  Temp(Src) 98.3 F (36.8 C) (Oral)  Resp 16  Ht 5' 1.5" (1.562 m)  Wt 130 lb (58.968 kg)  BMI 24.17 kg/m2  SpO2 99% Physical Exam  Constitutional: She appears well-developed and well-nourished. No distress.  HENT:  Head:  Normocephalic and atraumatic.  Right Ear: Hearing, external ear and ear canal normal. No drainage, swelling or tenderness. No mastoid tenderness. Tympanic membrane is injected and erythematous. Tympanic membrane is not retracted and not bulging. No middle ear effusion.  Left Ear: Hearing, external ear and ear canal normal. No drainage, swelling or tenderness. No mastoid tenderness. Tympanic membrane is injected and erythematous. Tympanic membrane is not retracted and not  bulging.  No middle ear effusion.  Nose: Mucosal edema and rhinorrhea present. Right sinus exhibits maxillary sinus tenderness. Right sinus exhibits no frontal sinus tenderness. Left sinus exhibits no frontal sinus tenderness.  Mouth/Throat: Uvula is midline, oropharynx is clear and moist and mucous membranes are normal. No oropharyngeal exudate, posterior oropharyngeal edema, posterior oropharyngeal erythema or tonsillar abscesses.  Eyes: Conjunctivae are normal. No scleral icterus.  Neck: Normal range of motion. Neck supple.  Cardiovascular: Normal rate, regular rhythm and normal heart sounds.   Pulmonary/Chest: Effort normal and breath sounds normal. No respiratory distress. She has no wheezes. She has no rales. She exhibits no tenderness.  Abdominal: Soft. She exhibits no distension. There is no tenderness.  Musculoskeletal: Normal range of motion.  Neurological: She is alert.  Skin: Skin is warm and dry. She is not diaphoretic.  Nursing note and vitals reviewed.   ED Course  Procedures (including critical care time) Labs Review Labs Reviewed - No data to display  Imaging Review No results found.   MDM   1. Acute maxillary sinusitis, recurrence not specified    Pt c/o worsening URI symptoms with worsening nasal congestion and bilateral ear pressure for 2 weeks. Sick contacts at home. Pt appears well, non-toxic, afebrile. Lungs: CTAB. No respiratory distress Rx: augmentin, saline nasal spray, mucinex DM. Advised to establish care with a primary care provider for ongoing healthcare needs and recheck of symptoms in 1 week if not improving. Patient verbalized understanding and agreement with treatment plan.      Junius Finner, PA-C 12/18/14 1145  Junius Finner, PA-C 12/18/14 1149

## 2015-04-08 ENCOUNTER — Encounter: Payer: Self-pay | Admitting: Emergency Medicine

## 2015-04-08 ENCOUNTER — Emergency Department (INDEPENDENT_AMBULATORY_CARE_PROVIDER_SITE_OTHER)
Admission: EM | Admit: 2015-04-08 | Discharge: 2015-04-08 | Disposition: A | Discharge status: Home / Self Care | Payer: 59 | Source: Home / Self Care | Attending: Family Medicine | Admitting: Family Medicine

## 2015-04-08 DIAGNOSIS — H9203 Otalgia, bilateral: Secondary | ICD-10-CM

## 2015-04-08 DIAGNOSIS — J329 Chronic sinusitis, unspecified: Secondary | ICD-10-CM

## 2015-04-08 DIAGNOSIS — H6983 Other specified disorders of Eustachian tube, bilateral: Secondary | ICD-10-CM

## 2015-04-08 MED ORDER — PREDNISONE 20 MG PO TABS: ORAL_TABLET | ORAL | Status: DC

## 2015-04-08 MED ORDER — DOXYCYCLINE HYCLATE 100 MG PO CAPS: 100.0000 mg | ORAL_CAPSULE | Freq: Two times a day (BID) | ORAL | Status: DC

## 2015-04-08 NOTE — Discharge Instructions (Signed)

## 2015-04-08 NOTE — ED Notes (Signed)
Pt c/o bilateral ear pain and sinus pressure x 1 week. 

## 2015-04-08 NOTE — ED Provider Notes (Signed)
CSN: [id number]     Arrival date & time 04/08/15  1300 History   First MD Initiated Contact with Patient 04/08/15 1338     Chief Complaint  Patient presents with  . Facial Pain  . Otalgia   (Consider location/radiation/quality/duration/timing/severity/associated sxs/prior Treatment) HPI Pt is a 34[facility]/o gradually worsening facial pain with bilateral ear pain and nasal congestion for 1 week. Pain is aching and sore, mild to moderate in severity. Associated mild sore throat and mild intermittent productive cough.  Subjective fever. Denies sick contacts or recent travel.  Denies n/v/d.  History reviewed. No pertinent past medical history. Past Surgical History  Procedure Laterality Date  . Cesarean section  2005, 2010  . Wisdom tooth extraction     Family History  Problem Relation Age of Onset  . Diabetes Mother   . Hypertension Mother   . Thyroid disease Mother   . Diabetes Father   . Hypertension Father   . Emphysema Father    Social History  Substance Use Topics  . Smoking status: Never Smoker   . Smokeless tobacco: None  . Alcohol Use: No   OB History    No data available     Review of Systems  Constitutional: Positive for fever and chills.  HENT: Positive for congestion, ear pain ( bilateral), postnasal drip, rhinorrhea, sinus pressure and sore throat. Negative for trouble swallowing and voice change.   Respiratory: Positive for cough. Negative for shortness of breath.   Cardiovascular: Negative for chest pain and palpitations.  Gastrointestinal: Negative for nausea, vomiting, abdominal pain and diarrhea.  Musculoskeletal: Negative for myalgias, back pain and arthralgias.  Skin: Negative for rash.    Allergies  Codeine  Home Medications   Prior to Admission medications   Medication Sig Start Date End Date Taking? Authorizing Provider  amoxicillin-clavulanate (AUGMENTIN) 875-125 MG per tablet Take 1 tablet by mouth 2 (two) times  daily. One po bid x 7 days 12/18/14   Junius FinnerErin O'Malley, PA-C  azithromycin (ZITHROMAX) 250 MG tablet Take 1 tablet (250 mg total) by mouth daily. Take first 2 tablets together, then 1 every day until finished. 04/03/14   Rodolph BongEvan S Corey, MD  dextromethorphan-guaiFENesin [facility](MUCINEX DM) 30-600 MG per 12 hr tablet Take 1 tablet by mouth 2 (two) times daily. Take with large glass of water 12/18/14   Junius FinnerErin O'Malley, PA-C  doxycycline (VIBRAMYCIN) 100 MG capsule Take 1 capsule (100 mg total) by mouth 2 (two) times daily. One po bid x 7 days 04/08/15   Junius FinnerErin O'Malley, PA-C  ipratropium (ATROVENT) 0.06 % nasal spray Place 2 sprays into both nostrils 4 (four) times daily. 04/03/14   Rodolph BongEvan S Corey, MD  levonorgestrel (MIRENA) 20 MCG/24HR IUD 1 each by Intrauterine route once.    Historical Provider, MD  predniSONE (DELTASONE) 10 MG tablet Take 3 tablets (30 mg total) by mouth daily. 04/03/14   Rodolph BongEvan S Corey, MD  predniSONE (DELTASONE) 20 MG tablet 3 tabs po day one, then 2 po daily x 4 days 04/08/15   Junius FinnerErin O'Malley, PA-C  sodium chloride (OCEAN) 0.65 % SOLN nasal spray Place 1 spray into both nostrils as needed. 12/18/14   Junius FinnerErin O'Malley, PA-C   Meds Ordered and Administered this Visit  Medications - No data to display  BP 104/70 mmHg  Pulse 70  Temp(Src) 97.9 F (36.6 C) (Oral)  Wt 131 lb (59.421 kg)  SpO2 99% No data found.   Physical Exam  Constitutional: She appears well-developed  and well-nourished. No distress.  HENT:  Head: Normocephalic and atraumatic.  Right Ear: Hearing, tympanic membrane, external ear and ear canal normal.  Left Ear: Hearing, tympanic membrane, external ear and ear canal normal.  Nose: Mucosal edema present. Right sinus exhibits maxillary sinus tenderness and frontal sinus tenderness. Left sinus exhibits maxillary sinus tenderness and frontal sinus tenderness.  Mouth/Throat: Uvula is midline and mucous membranes are normal. Posterior oropharyngeal erythema present. No oropharyngeal  exudate, posterior oropharyngeal edema or tonsillar abscesses.  Eyes: Conjunctivae are normal. No scleral icterus.  Neck: Normal range of motion. Neck supple.  Cardiovascular: Normal rate, regular rhythm and normal heart sounds.   Pulmonary/Chest: Effort normal and breath sounds normal. No respiratory distress. She has no wheezes. She has no rales. She exhibits no tenderness.  Abdominal: Soft. Bowel sounds are normal. She exhibits no distension and no mass. There is no tenderness. There is no rebound and no guarding.  Musculoskeletal: Normal range of motion.  Neurological: She is alert.  Skin: Skin is warm and dry. She is not diaphoretic.  Nursing note and vitals reviewed.   ED Course  Procedures (including critical care time)  Labs Review Labs Reviewed - No data to display  Imaging Review No results found.  Tympanometry: Right & Left ear: negative tympanometric peak pressure   MDM   1. Recurrent rhinosinusitis   2. Ear pain, bilateral   3. Eustachian tube dysfunction, bilateral    Pt c/o nasal congestion with facial pain, sore throat, ear pain and cough. Worsening over 1 week. Hx of prior sinus infections.  Tympanometry- negative peak pressure c/w eustachian tube dysfunction.  Will tx for rhinosinusitis.  Rx: doxycycline and prednisone. Advised pt to use acetaminophen and ibuprofen as needed for fever and pain. Encouraged rest and fluids. F/u with PCP in 7-10 days if not improving, sooner if worsening. Pt verbalized understanding and agreement with tx plan.    Junius Finner, PA-C 04/10/15 [phone]

## 2015-07-14 DIAGNOSIS — N200 Calculus of kidney: Secondary | ICD-10-CM | POA: Insufficient documentation

## 2016-03-04 DIAGNOSIS — E538 Deficiency of other specified B group vitamins: Secondary | ICD-10-CM | POA: Insufficient documentation

## 2016-03-04 DIAGNOSIS — E559 Vitamin D deficiency, unspecified: Secondary | ICD-10-CM | POA: Insufficient documentation

## 2016-03-18 DIAGNOSIS — R202 Paresthesia of skin: Secondary | ICD-10-CM

## 2016-03-18 DIAGNOSIS — G43109 Migraine with aura, not intractable, without status migrainosus: Secondary | ICD-10-CM | POA: Insufficient documentation

## 2016-03-18 DIAGNOSIS — R55 Syncope and collapse: Secondary | ICD-10-CM | POA: Insufficient documentation

## 2016-03-18 DIAGNOSIS — R200 Anesthesia of skin: Secondary | ICD-10-CM | POA: Insufficient documentation

## 2018-03-02 ENCOUNTER — Other Ambulatory Visit: Payer: Self-pay

## 2018-03-02 ENCOUNTER — Encounter: Payer: Self-pay | Admitting: *Deleted

## 2018-03-02 ENCOUNTER — Emergency Department (INDEPENDENT_AMBULATORY_CARE_PROVIDER_SITE_OTHER)
Admission: EM | Admit: 2018-03-02 | Discharge: 2018-03-02 | Disposition: A | Discharge status: Home / Self Care | Source: Home / Self Care | Attending: Family Medicine | Admitting: Family Medicine

## 2018-03-02 DIAGNOSIS — J019 Acute sinusitis, unspecified: Secondary | ICD-10-CM

## 2018-03-02 DIAGNOSIS — B9689 Other specified bacterial agents as the cause of diseases classified elsewhere: Secondary | ICD-10-CM

## 2018-03-02 HISTORY — DX: Deficiency of other specified B group vitamins: E53.8

## 2018-03-02 HISTORY — DX: Vitamin D deficiency, unspecified: E55.9

## 2018-03-02 MED ORDER — AZITHROMYCIN 250 MG PO TABS: 250.0000 mg | ORAL_TABLET | Freq: Every day | ORAL | 0 refills | Status: DC

## 2018-03-02 NOTE — ED Triage Notes (Signed)
Pt c/o sore throat, fatigue, dental pain, ear pressure and nasal drainage x 10 days. [facility] was told take Sudafed. Denies fever.

## 2018-03-02 NOTE — ED Provider Notes (Signed)
[facility]    CSN: [id number] Arrival date & time: 03/02/18  1258     History   Chief Complaint Chief Complaint  Patient presents with  . Fatigue  . Sore Throat    HPI [name] is a 38 y.o. female.   HPI [name] is a 37 y.o. female presenting to UC with c/o 10 days worsening sinus congestion with facial pain and pressure, sore throat, fatigue, ear pain and post-nasal drip.  She was seen at a [facility] 1 week ago, advised to take Sudafed but symptoms have only worsened. Hx of sinus and ear infections. Denies fever, chills, n/v/d.    Past Medical History:  Diagnosis Date  . Vitamin B12 deficiency   . Vitamin D deficiency     There are no active problems to display for this patient.   Past Surgical History:  Procedure Laterality Date  . CESAREAN SECTION  2005, 2010  . WISDOM TOOTH EXTRACTION      OB History   None      Home Medications    Prior to Admission medications   Medication Sig Start Date End Date Taking? Authorizing Provider  azithromycin (ZITHROMAX) 250 MG tablet Take 1 tablet (250 mg total) by mouth daily. Take first 2 tablets together, then 1 every day until finished. 03/02/18   Lurene Shadow, PA-C  levonorgestrel (MIRENA) 20 MCG/24HR IUD 1 each by Intrauterine route once.    [provider]    Family History Family History  Problem Relation Age of Onset  . Diabetes Mother   . Hypertension Mother   . Thyroid disease Mother   . Diabetes Father   . Hypertension Father   . Emphysema Father   . Breast cancer Maternal Grandmother     Social History Social History   Tobacco Use  . Smoking status: Never Smoker  . Smokeless tobacco: Never Used  Substance Use Topics  . Alcohol use: No  . Drug use: No     Allergies   Codeine   Review of Systems Review of Systems  Constitutional: Negative for chills and fever.  HENT: Positive for congestion, ear pain, postnasal drip, sinus pressure, sinus pain, sneezing and  sore throat. Negative for trouble swallowing and voice change.   Respiratory: Positive for cough. Negative for shortness of breath.   Cardiovascular: Negative for chest pain and palpitations.  Gastrointestinal: Positive for nausea. Negative for abdominal pain, diarrhea and vomiting.  Musculoskeletal: Negative for arthralgias, back pain and myalgias.  Skin: Negative for rash.  Neurological: Positive for headaches. Negative for dizziness and light-headedness.     Physical Exam Triage Vital Signs ED Triage Vitals [03/02/18 1322]  Enc Vitals Group     BP 113/78     Pulse Rate 66     Resp 16     Temp 98.6 F (37 C)     Temp Source Oral     SpO2 99 %     Weight 144 lb (65.3 kg)     Height 5\' 1"  (1.549 m)     Head Circumference      Peak Flow      Pain Score 0     Pain Loc      Pain Edu?      Excl. in GC?    No data found.  Updated Vital Signs BP 113/78 (BP Location: Right Arm)   Pulse 66   Temp 98.6 F (37 C) (Oral)   Resp 16   Ht 5\' 1"  (  1.549 m)   Wt 144 lb (65.3 kg)   SpO2 99%   BMI 27.21 kg/m   Visual Acuity Right Eye Distance:   Left Eye Distance:   Bilateral Distance:    Right Eye Near:   Left Eye Near:    Bilateral Near:     Physical Exam  Constitutional: She is oriented to person, place, and time. She appears well-developed and well-nourished.  Non-toxic appearance. She does not appear ill. No distress.  HENT:  Head: Normocephalic and atraumatic.  Right Ear: Tympanic membrane is not erythematous and not bulging. A middle ear effusion is present.  Left Ear: Tympanic membrane normal.  Nose: Right sinus exhibits maxillary sinus tenderness and frontal sinus tenderness. Left sinus exhibits maxillary sinus tenderness and frontal sinus tenderness.  Mouth/Throat: Uvula is midline, oropharynx is clear and moist and mucous membranes are normal.  Eyes: EOM are normal.  Neck: Normal range of motion. Neck supple.  Cardiovascular: Normal rate and regular rhythm.    Pulmonary/Chest: Effort normal and breath sounds normal. No stridor. No respiratory distress. She has no wheezes. She has no rhonchi.  Musculoskeletal: Normal range of motion.  Lymphadenopathy:    She has no cervical adenopathy.  Neurological: She is alert and oriented to person, place, and time.  Skin: Skin is warm and dry.  Psychiatric: She has a normal mood and affect. Her behavior is normal.  Nursing note and vitals reviewed.    UC Treatments / Results  Labs (all labs ordered are listed, but only abnormal results are displayed) Labs Reviewed - No data to display  EKG None  Radiology No results found.  Procedures Procedures (including critical care time)  Medications Ordered in UC Medications - No data to display  Initial Impression / Assessment and Plan / UC Course  I have reviewed the triage vital signs and the nursing notes.  Pertinent labs & imaging results that were available during my care of the patient were reviewed by me and considered in my medical decision making (see chart for details).     Hx and exam c/w sinusitis Will start pt on azithromycin. Pt states last time she had Augmentin, she needed to be re-seen and treated with actinomycin  Final Clinical Impressions(s) / UC Diagnoses   Final diagnoses:  Acute bacterial rhinosinusitis     Discharge Instructions      Please take antibiotics as prescribed and be sure to complete entire course even if you start to feel better to ensure infection does not come back.  Please follow up with family medicine in 1 week if needed.    ED Prescriptions    Medication Sig Dispense Auth. Provider   azithromycin (ZITHROMAX) 250 MG tablet Take 1 tablet (250 mg total) by mouth daily. Take first 2 tablets together, then 1 every day until finished. 6 tablet Lurene Shadow, PA-C     Controlled Substance Prescriptions [place] Controlled Substance Registry consulted? Not Applicable   Rolla Plate 03/02/18  1337

## 2018-03-02 NOTE — Discharge Instructions (Signed)
°  Please take antibiotics as prescribed and be sure to complete entire course even if you start to feel better to ensure infection does not come back. ° °Please follow up with family medicine in 1 week if needed.  °

## 2018-03-04 ENCOUNTER — Ambulatory Visit (INDEPENDENT_AMBULATORY_CARE_PROVIDER_SITE_OTHER): Admitting: Osteopathic Medicine

## 2018-03-04 ENCOUNTER — Encounter: Payer: Self-pay | Admitting: Osteopathic Medicine

## 2018-03-04 VITALS — BP 99/67 | HR 60 | Temp 98.4°F | Ht 61.0 in | Wt 145.0 lb

## 2018-03-04 DIAGNOSIS — J019 Acute sinusitis, unspecified: Secondary | ICD-10-CM

## 2018-03-04 DIAGNOSIS — E538 Deficiency of other specified B group vitamins: Secondary | ICD-10-CM | POA: Diagnosis not present

## 2018-03-04 DIAGNOSIS — E559 Vitamin D deficiency, unspecified: Secondary | ICD-10-CM | POA: Diagnosis not present

## 2018-03-04 DIAGNOSIS — K5909 Other constipation: Secondary | ICD-10-CM

## 2018-03-04 DIAGNOSIS — N765 Ulceration of vagina: Secondary | ICD-10-CM | POA: Insufficient documentation

## 2018-03-04 DIAGNOSIS — R635 Abnormal weight gain: Secondary | ICD-10-CM | POA: Diagnosis not present

## 2018-03-04 DIAGNOSIS — R5382 Chronic fatigue, unspecified: Secondary | ICD-10-CM | POA: Diagnosis not present

## 2018-03-04 DIAGNOSIS — IMO0002 Reserved for concepts with insufficient information to code with codable children: Secondary | ICD-10-CM | POA: Insufficient documentation

## 2018-03-04 MED ORDER — LINACLOTIDE 145 MCG PO CAPS: 145.0000 ug | ORAL_CAPSULE | Freq: Every day | ORAL | 2 refills | Status: AC

## 2018-03-04 NOTE — Patient Instructions (Signed)
Plan: Labs today Try Linzess Rx for constipation, also hydration, increased fiber Intentional exercise will help constipation and build endurance Will consider cardiac/pulmonary workup as needed Plan to follow up to discuss results and recheck symptoms in 2 weeks or so!

## 2018-03-04 NOTE — Progress Notes (Signed)
HPI: [name] is a 37 y.o. female who  has a past medical history of Vitamin B12 deficiency and Vitamin D deficiency.  she presents to [facility] today, 03/04/18,  for chief complaint of: New to establish care Concern for vitamin deficiency and fatigue, recently treated in our urgent care for sinus   Fatigue: ongoing problem. >5 years. Busy mom of 4, no intentional exercise but is active, eats relatively healthy. Possible concern with anxiety, no significant depression history.    B12 deficiency: low at 153 on 02/23/16; MMA normal low range at 88 on 02/27/16  Vitamin D deficiency: last measurement 29 on 02/22/18  Chronic constipation: ongoing for years, about 2 BM/week, hard and painful, no formal fiber regimen, concerned about dehydration.   Recent been in urgent care for sinusitis, still getting over a bit, concern with chest congestion, sore throat, sinus pressure.   GAD 7 : Generalized Anxiety Score 03/04/2018  Nervous, Anxious, on Edge 1  Control/stop worrying 0  Worry too much - different things 0  Trouble relaxing 1  Restless 1  Easily annoyed or irritable 0  Afraid - awful might happen 0  Total GAD 7 Score 3  Anxiety Difficulty Somewhat difficult    Depression screen PHQ 2/9 03/04/2018  Decreased Interest 2  Down, Depressed, Hopeless 1  PHQ - 2 Score 3  Altered sleeping 1  Tired, decreased energy 3  Change in appetite 2  Feeling bad or failure about yourself  0  Trouble concentrating 0  Moving slowly or fidgety/restless 0  Suicidal thoughts 0  PHQ-9 Score 9  Difficult doing work/chores Somewhat difficult   G2P2 without complication. IUD currently in place: Mirena. Sees OBGYN.        Past medical, surgical, social and family history reviewed:  Patient Active Problem List   Diagnosis Date Noted  . Dyspareunia 03/04/2018  . Vaginal ulcer 03/04/2018  . Bilateral numbness and tingling of arms and legs 03/18/2016  .  Migraine with aura and without status migrainosus, not intractable 03/18/2016  . Syncope 03/18/2016  . Vitamin B12 deficiency 03/04/2016  . Vitamin D insufficiency 03/04/2016  . Kidney stone 07/14/2015    Past Surgical History:  Procedure Laterality Date  . CESAREAN SECTION  2005, 2010  . WISDOM TOOTH EXTRACTION      Social History   Tobacco Use  . Smoking status: Never Smoker  . Smokeless tobacco: Never Used  Substance Use Topics  . Alcohol use: No    Family History  Problem Relation Age of Onset  . Diabetes Mother   . Hypertension Mother   . Thyroid disease Mother   . Diabetes Father   . Hypertension Father   . Emphysema Father   . Breast cancer Maternal Grandmother      Current medication list and allergy/intolerance information reviewed:    Current Outpatient Medications  Medication Sig Dispense Refill  . azithromycin (ZITHROMAX) 250 MG tablet Take 1 tablet (250 mg total) by mouth daily. Take first 2 tablets together, then 1 every day until finished. 6 tablet 0  . levonorgestrel (MIRENA) 20 MCG/24HR IUD 1 each by Intrauterine route once.     No current facility-administered medications for this visit.     Allergies  Allergen Reactions  . Codeine       Review of Systems:  Constitutional:  No  fever, no chills, +recent illness, +unintentional weight gain. +significant fatigue.   HEENT: +headache, no vision change, no hearing change, +sore throat, +  sinus pressure  Cardiac: No  chest pain, +pressure, No palpitations, No  Orthopnea  Respiratory:  No  shortness of breath. +Cough  Gastrointestinal: +abdominal pain, +nausea, No  vomiting,  No  blood in stool, No  diarrhea, +constipation, +blood in stool from hemorrhoids   Musculoskeletal: No new myalgia/arthralgia  Skin: No  Rash, No other wounds/concerning lesions  Genitourinary: No  incontinence, No  abnormal genital bleeding, No abnormal genital discharge, +blood in urine - historical  Hem/Onc: +easy  bruising, no easy bleeding, No  abnormal lymph node  Endocrine: No cold intolerance,  No heat intolerance. No polyuria/polydipsia/polyphagia   Neurologic: No  weakness, No  dizziness, No  slurred speech/focal weakness/facial droop  Psychiatric: No  concerns with depression, No  concerns with anxiety, No sleep problems, No mood problems  Exam:  BP 99/67   Pulse 60   Temp 98.4 F (36.9 C) (Oral)   Ht 5\' 1"  (1.549 m)   Wt 145 lb (65.8 kg)   BMI 27.40 kg/m   Constitutional: VS see above. General Appearance: alert, well-developed, well-nourished, NAD  Eyes: Normal lids and conjunctive, non-icteric sclera  Ears, Nose, Mouth, Throat: MMM, Normal external inspection ears/nares/mouth/lips/gums. TM normal bilaterally. Pharynx/tonsils no erythema, no exudate. Nasal mucosa normal.   Neck: No masses, trachea midline. No thyroid enlargement. No tenderness/mass appreciated. No lymphadenopathy  Respiratory: Normal respiratory effort. no wheeze, no rhonchi, no rales  Cardiovascular: S1/S2 normal, no murmur, no rub/gallop auscultated. RRR. No lower extremity edema. Pedal pulse II/IV bilaterally DP and PT. No carotid bruit or JVD. No abdominal aortic bruit.  Gastrointestinal: Nontender, no masses. No hepatomegaly, no splenomegaly. No hernia appreciated. Bowel sounds normal. Rectal exam deferred.   Musculoskeletal: Gait normal. No clubbing/cyanosis of digits.   Neurological: Normal balance/coordination. No tremor. No cranial nerve deficit on limited exam.   Skin: warm, dry, intact. No rash/ulcer. No concerning nevi or subq nodules on limited exam.    Psychiatric: Normal judgment/insight. Normal mood and affect. Oriented x3.      ASSESSMENT/PLAN: The primary encounter diagnosis was Chronic fatigue. Diagnoses of Vitamin D insufficiency, Vitamin B12 deficiency, Abnormal weight gain, Acute sinusitis, recurrence not specified, unspecified location, and Chronic constipation were also pertinent to  this visit.    Orders Placed This Encounter  Procedures  . CBC  . COMPLETE METABOLIC PANEL WITH GFR  . Lipid panel  . TSH  . Hemoglobin A1c  . VITAMIN D 25 Hydroxy (Vit-D Deficiency, Fractures)  . Vitamin B12  . Urinalysis, Routine w reflex microscopic       Patient Instructions  Plan: Labs today Try Linzess Rx for constipation, also hydration, increased fiber Intentional exercise will help constipation and build endurance Will consider cardiac/pulmonary workup as needed Plan to follow up to discuss results and recheck symptoms in 2 weeks or so!    Meds ordered this encounter  Medications  . linaclotide (LINZESS) 145 MCG CAPS capsule    Sig: Take 1 capsule (145 mcg total) by mouth daily before breakfast.    Dispense:  30 capsule    Refill:  2     Visit summary with medication list and pertinent instructions was printed for patient to review. All questions at time of visit were answered - patient instructed to contact office with any additional concerns. ER/RTC precautions were reviewed with the patient.   Follow-up plan: Return in about 2 weeks (around 03/18/2018) for recheck fatigue/constipation symptoms and review lab results.     Please note: voice recognition software was used to  produce this document, and typos may escape review. Please contact Dr. Sheppard Coil for any needed clarifications.

## 2018-03-05 ENCOUNTER — Telehealth: Payer: Self-pay

## 2018-03-05 LAB — LIPID PANEL
Cholesterol: 146 mg/dL (ref <200)
HDL: 58 mg/dL (ref >50)
LDL CHOLESTEROL (CALC): 76 mg/dL
Non-HDL Cholesterol (Calc): 88 mg/dL (calc) (ref <130)
Total CHOL/HDL Ratio: 2.5 (calc) (ref <5.0)
Triglycerides: 50 mg/dL (ref <150)

## 2018-03-05 LAB — URINALYSIS, ROUTINE W REFLEX MICROSCOPIC
BILIRUBIN URINE: NEGATIVE
Bacteria, UA: NONE SEEN /HPF
GLUCOSE, UA: NEGATIVE
Hgb urine dipstick: NEGATIVE
Hyaline Cast: NONE SEEN /LPF
Ketones, ur: NEGATIVE
NITRITE: NEGATIVE
PROTEIN: NEGATIVE
RBC / HPF: NONE SEEN /HPF (ref 0–2)
Specific Gravity, Urine: 1.021 (ref 1.001–1.03)
pH: 7.5 (ref 5.0–8.0)

## 2018-03-05 LAB — COMPLETE METABOLIC PANEL WITH GFR
AG RATIO: 2.1 (calc) (ref 1.0–2.5)
ALBUMIN MSPROF: 4.9 g/dL (ref 3.6–5.1)
ALKALINE PHOSPHATASE (APISO): 57 U/L (ref 33–115)
ALT: 13 U/L (ref 6–29)
AST: 12 U/L (ref 10–30)
BILIRUBIN TOTAL: 0.6 mg/dL (ref 0.2–1.2)
BUN: 13 mg/dL (ref 7–25)
CHLORIDE: 104 mmol/L (ref 98–110)
CO2: 25 mmol/L (ref 20–32)
Calcium: 9.6 mg/dL (ref 8.6–10.2)
Creat: 0.78 mg/dL (ref 0.50–1.10)
GFR, EST AFRICAN AMERICAN: 113 mL/min/{1.73_m2} (ref >=60)
GFR, Est Non African American: 97 mL/min/{1.73_m2} (ref >=60)
GLOBULIN: 2.3 g/dL (ref 1.9–3.7)
GLUCOSE: 86 mg/dL (ref 65–99)
Potassium: 4.2 mmol/L (ref 3.5–5.3)
SODIUM: 137 mmol/L (ref 135–146)
TOTAL PROTEIN: 7.2 g/dL (ref 6.1–8.1)

## 2018-03-05 LAB — CBC
HEMATOCRIT: 39.9 % (ref 35.0–45.0)
Hemoglobin: 13.6 g/dL (ref 11.7–15.5)
MCH: 31.7 pg (ref 27.0–33.0)
MCHC: 34.1 g/dL (ref 32.0–36.0)
MCV: 93 fL (ref 80.0–100.0)
MPV: 10.4 fL (ref 7.5–12.5)
Platelets: 266 10*3/uL (ref 140–400)
RBC: 4.29 10*6/uL (ref 3.80–5.10)
RDW: 12.1 % (ref 11.0–15.0)
WBC: 8 10*3/uL (ref 3.8–10.8)

## 2018-03-05 LAB — HEMOGLOBIN A1C
EAG (MMOL/L): 5.5 (calc)
Hgb A1c MFr Bld: 5.1 % of total Hgb (ref <5.7)
Mean Plasma Glucose: 100 (calc)

## 2018-03-05 LAB — TSH: TSH: 0.63 m[IU]/L

## 2018-03-05 LAB — VITAMIN D 25 HYDROXY (VIT D DEFICIENCY, FRACTURES): Vit D, 25-Hydroxy: 27 ng/mL — ABNORMAL LOW (ref 30–100)

## 2018-03-05 LAB — VITAMIN B12: Vitamin B-12: 325 pg/mL (ref 200–1100)

## 2018-03-05 MED ORDER — VITAMIN D (ERGOCALCIFEROL) 1.25 MG (50000 UNIT) PO CAPS: 50000.0000 [IU] | ORAL_CAPSULE | ORAL | 0 refills | Status: AC

## 2018-03-05 MED ORDER — AMOXICILLIN-POT CLAVULANATE 875-125 MG PO TABS: 1.0000 | ORAL_TABLET | Freq: Two times a day (BID) | ORAL | 0 refills | Status: DC

## 2018-03-05 NOTE — Telephone Encounter (Signed)
Left a detailed vm msg for pt regarding provider's note. Direct call back info provided.  

## 2018-03-05 NOTE — Addendum Note (Signed)
Addended by: Deirdre Pippins on: 03/05/2018 09:03 AM   Modules accepted: Orders

## 2018-03-05 NOTE — Telephone Encounter (Signed)
Pt called - as per pt,she is on day 4 of azithromycin rx and is feeling worse. Pt stated that she is having severe congestion, chest feels "heavy", having a hard time breathing & lightheadedness when standing for long period of times. Wants to know if she needs to come in again & if she should continue taking antibiotics. Pls advise, thanks.

## 2018-03-05 NOTE — Telephone Encounter (Signed)
I can try sending an alternative antibiotic, but if the issue is viral rather than bacterial this will not make a difference.  She can stop the azithromycin if she is worried about it.  If she continues to be worse after switching antibiotics, she will need to be seen

## 2018-03-18 ENCOUNTER — Encounter: Payer: Self-pay | Admitting: Osteopathic Medicine

## 2018-03-18 ENCOUNTER — Ambulatory Visit (INDEPENDENT_AMBULATORY_CARE_PROVIDER_SITE_OTHER): Admitting: Osteopathic Medicine

## 2018-03-18 VITALS — BP 117/68 | HR 61 | Temp 98.1°F | Wt 147.0 lb

## 2018-03-18 DIAGNOSIS — E538 Deficiency of other specified B group vitamins: Secondary | ICD-10-CM

## 2018-03-18 DIAGNOSIS — R5382 Chronic fatigue, unspecified: Secondary | ICD-10-CM

## 2018-03-18 DIAGNOSIS — E559 Vitamin D deficiency, unspecified: Secondary | ICD-10-CM | POA: Diagnosis not present

## 2018-03-18 DIAGNOSIS — K5909 Other constipation: Secondary | ICD-10-CM | POA: Diagnosis not present
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
## 2018-03-19 ENCOUNTER — Encounter: Payer: Self-pay | Admitting: Osteopathic Medicine

## 2018-04-28 ENCOUNTER — Other Ambulatory Visit: Payer: Self-pay | Admitting: Osteopathic Medicine

## 2018-04-28 DIAGNOSIS — E559 Vitamin D deficiency, unspecified: Secondary | ICD-10-CM

## 2018-06-08 LAB — HM PAP SMEAR: HM Pap smear: NEGATIVE

## 2018-06-08 LAB — HM MAMMOGRAPHY

## 2018-07-09 ENCOUNTER — Other Ambulatory Visit: Payer: Self-pay

## 2018-07-09 ENCOUNTER — Emergency Department (INDEPENDENT_AMBULATORY_CARE_PROVIDER_SITE_OTHER)
Admission: EM | Admit: 2018-07-09 | Discharge: 2018-07-09 | Disposition: A | Discharge status: Home / Self Care | Source: Home / Self Care | Attending: Emergency Medicine | Admitting: Emergency Medicine

## 2018-07-09 DIAGNOSIS — R69 Illness, unspecified: Secondary | ICD-10-CM | POA: Diagnosis not present

## 2018-07-09 DIAGNOSIS — J111 Influenza due to unidentified influenza virus with other respiratory manifestations: Secondary | ICD-10-CM

## 2018-07-09 DIAGNOSIS — H6591 Unspecified nonsuppurative otitis media, right ear: Secondary | ICD-10-CM

## 2018-07-09 MED ORDER — AMOXICILLIN 875 MG PO TABS: 875.0000 mg | ORAL_TABLET | Freq: Two times a day (BID) | ORAL | 0 refills | Status: DC

## 2018-07-09 MED ORDER — OSELTAMIVIR PHOSPHATE 75 MG PO CAPS: 75.0000 mg | ORAL_CAPSULE | Freq: Two times a day (BID) | ORAL | 0 refills | Status: DC

## 2018-07-09 NOTE — Discharge Instructions (Signed)
Take antibiotics as instructed. Take Tamiflu as instructed. If you develop worsening shortness of breath or chest pain please present to the emergency room. I have given you a prescription for your husband.

## 2018-07-09 NOTE — ED Provider Notes (Signed)
[facility]    CSN: [id number] Arrival date & time: 07/09/18  0848     History   Chief Complaint Chief Complaint  Patient presents with  . Generalized Body Aches  . Cough  . Nasal Congestion  . Fatigue    HPI [name] is a 38 y.o. female.   HPI Patient enters with a chief complaint of exposure to influenza.  She kept a toddler over the weekend who tested positive for influenza A.  Yesterday her stepdaughter went to the doctor for a high fever and tested positive for flu A..  The patient states she started feeling fatigued on Sunday on Monday she had a slight cough but 24 hours ago she began with severe myalgias feeling feverish and very tired.  She has had a flu shot she is a non-smoker has no ongoing medical problems and has not had any recent travel. Past Medical History:  Diagnosis Date  . Vitamin B12 deficiency   . Vitamin D deficiency     Patient Active Problem List   Diagnosis Date Noted  . Dyspareunia 03/04/2018  . Vaginal ulcer 03/04/2018  . Bilateral numbness and tingling of arms and legs 03/18/2016  . Migraine with aura and without status migrainosus, not intractable 03/18/2016  . Syncope 03/18/2016  . Vitamin B12 deficiency 03/04/2016  . Vitamin D insufficiency 03/04/2016  . Kidney stone 07/14/2015    Past Surgical History:  Procedure Laterality Date  . CESAREAN SECTION  2005, 2010  . WISDOM TOOTH EXTRACTION      OB History   No obstetric history on file.      Home Medications    Prior to Admission medications   Medication Sig Start Date End Date Taking? Authorizing Provider  amoxicillin (AMOXIL) 875 MG tablet Take 1 tablet (875 mg total) by mouth 2 (two) times daily. 07/09/18   Collene Gobble, MD  levonorgestrel (MIRENA) 20 MCG/24HR IUD 1 each by Intrauterine route once.    [provider]  linaclotide Karlene Einstein) 145 MCG CAPS capsule Take 1 capsule (145 mcg total) by mouth daily before breakfast. Patient not taking:  Reported on 03/18/2018 03/04/18   Sunnie Nielsen, DO  oseltamivir (TAMIFLU) 75 MG capsule Take 1 capsule (75 mg total) by mouth every 12 (twelve) hours. 07/09/18   Collene Gobble, MD  Vitamin D, Ergocalciferol, (DRISDOL) 50000 units CAPS capsule Take 1 capsule (50,000 Units total) by mouth every 7 (seven) days. Take for 8 total doses(weeks) 03/05/18   Sunnie Nielsen, DO    Family History Family History  Problem Relation Age of Onset  . Diabetes Mother   . Hypertension Mother   . Thyroid disease Mother   . Diabetes Father   . Hypertension Father   . Emphysema Father   . Heart attack Father   . Breast cancer Maternal Grandmother   . Diabetes Maternal Grandmother     Social History Social History   Tobacco Use  . Smoking status: Never Smoker  . Smokeless tobacco: Never Used  Substance Use Topics  . Alcohol use: No  . Drug use: No     Allergies   Codeine   Review of Systems Review of Systems  Constitutional: Positive for chills, fatigue and fever.  HENT: Positive for congestion and ear pain. Negative for sinus pain and sore throat.   Eyes: Negative.   Respiratory: Positive for cough. Negative for shortness of breath.   Cardiovascular: Negative.   Gastrointestinal: Negative.   Musculoskeletal: Positive for myalgias.  Skin: Negative.      Physical Exam Triage Vital Signs ED Triage Vitals  Enc Vitals Group     BP 07/09/18 0923 94/65     Pulse Rate 07/09/18 0923 63     Resp 07/09/18 0923 20     Temp 07/09/18 0923 98.3 F (36.8 C)     Temp Source 07/09/18 0923 Oral     SpO2 07/09/18 0923 100 %     Weight 07/09/18 0924 149 lb (67.6 kg)     Height 07/09/18 0924  (1.549 m)     Head Circumference --      Peak Flow --      Pain Score 07/09/18 0924 0     Pain Loc --      Pain Edu? --      Excl. in GC? --    No data found.  Updated Vital Signs BP 94/65 (BP Location: Right Arm)   Pulse 63   Temp 98.3 F (36.8 C) (Oral)   Resp 20   Ht  (1.549  m)   Wt 67.6 kg   LMP 06/29/2018   SpO2 100%   BMI 28.15 kg/m   Visual Acuity Right Eye Distance:   Left Eye Distance:   Bilateral Distance:    Right Eye Near:   Left Eye Near:    Bilateral Near:     Physical Exam Constitutional:      Appearance: Normal appearance.  HENT:     Head: Normocephalic.     Ears:     Comments: The right TM is red with fluid present behind the drum.    Nose: Nose normal.     Mouth/Throat:     Mouth: Mucous membranes are moist.     Pharynx: Oropharynx is clear. No posterior oropharyngeal erythema.  Eyes:     Pupils: Pupils are equal, round, and reactive to light.  Neck:     Musculoskeletal: Normal range of motion and neck supple.  Cardiovascular:     Rate and Rhythm: Normal rate and regular rhythm.  Pulmonary:     Effort: Pulmonary effort is normal.  Lymphadenopathy:     Cervical: No cervical adenopathy.  Neurological:     Mental Status: She is alert.      UC Treatments / Results  Labs (all labs ordered are listed, but only abnormal results are displayed) Labs Reviewed - No data to display  EKG None  Radiology No results found.  Procedures Procedures (including critical care time)  Medications Ordered in UC Medications - No data to display  Initial Impression / Assessment and Plan / UC Course  I have reviewed the triage vital signs and the nursing notes.  Pertinent labs & imaging results that were available during my care of the patient were reviewed by me and considered in my medical decision making (see chart for details). Patient has flu symptoms with multiple exposures to influenza.  She was given a prescription for Tamiflu and also she is developing a right otitis media with a history of same and she will be treated with amoxicillin for this.  She was given Lawyer for cough     Final Clinical Impressions(s) / UC Diagnoses   Final diagnoses:  Influenza-like illness  Right otitis media with effusion      Discharge Instructions     Take antibiotics as instructed. Take Tamiflu as instructed. If you develop worsening shortness of breath or chest pain please present to the emergency room. I have  given you a prescription for your husband.    ED Prescriptions    Medication Sig Dispense Auth. Provider   amoxicillin (AMOXIL) 875 MG tablet Take 1 tablet (875 mg total) by mouth 2 (two) times daily. 20 tablet Collene Gobble, MD   oseltamivir (TAMIFLU) 75 MG capsule Take 1 capsule (75 mg total) by mouth every 12 (twelve) hours. 10 capsule Collene Gobble, MD     Controlled Substance Prescriptions [place] Controlled Substance Registry consulted? Not Applicable   Collene Gobble, MD 07/09/18 [phone]

## 2018-07-09 NOTE — ED Triage Notes (Signed)
Has had nasal congestion, cough for several days, Drainage, aches, feverish started yesterday.  Has been in contact with 2 people dx with Flu A.

## 2018-07-10 ENCOUNTER — Telehealth: Payer: Self-pay | Admitting: *Deleted

## 2018-07-10 NOTE — Telephone Encounter (Signed)
Pt called reports that she developed a rash after taking Tamiflu. Advised her to stop tamiflu and take benadryl, claritin or zyrtec. If the rash does not improve or if she develops oral swelling or SOB return to the clinic immediately. Pt verbalized understanding.

## 2018-07-11 ENCOUNTER — Emergency Department (INDEPENDENT_AMBULATORY_CARE_PROVIDER_SITE_OTHER)
Admission: EM | Admit: 2018-07-11 | Discharge: 2018-07-11 | Disposition: A | Discharge status: Home / Self Care | Source: Home / Self Care

## 2018-07-11 ENCOUNTER — Encounter: Payer: Self-pay | Admitting: Emergency Medicine

## 2018-07-11 DIAGNOSIS — T7840XA Allergy, unspecified, initial encounter: Secondary | ICD-10-CM

## 2018-07-11 DIAGNOSIS — L509 Urticaria, unspecified: Secondary | ICD-10-CM | POA: Diagnosis not present

## 2018-07-11 MED ORDER — TRIAMCINOLONE ACETONIDE 0.1 % EX CREA: 1.0000 "application " | TOPICAL_CREAM | Freq: Two times a day (BID) | CUTANEOUS | 0 refills | Status: DC

## 2018-07-11 MED ORDER — CETIRIZINE HCL 10 MG PO TABS: 10.0000 mg | ORAL_TABLET | Freq: Every day | ORAL | 0 refills | Status: DC

## 2018-07-11 MED ORDER — METHYLPREDNISOLONE SODIUM SUCC 40 MG IJ SOLR
80.0000 mg | Freq: Once | INTRAMUSCULAR | Status: AC
  Administered 2018-07-11: 80 mg via INTRAMUSCULAR

## 2018-07-11 MED ORDER — PREDNISONE 50 MG PO TABS: 50.0000 mg | ORAL_TABLET | Freq: Every day | ORAL | 0 refills | Status: AC

## 2018-07-11 NOTE — ED Provider Notes (Signed)
[facility]    CSN: [id number] Arrival date & time: 07/11/18  1054     History   Chief Complaint Chief Complaint  Patient presents with  . Rash    HPI [name] is a 38 y.o. female.   HPI [name] is a 38 y.o. female presenting to UC with c/o persistent itchy red rash all over her body that started 5 days ago after taking 2 doses of Tamiflu.  She was also started on amoxicillin for a Right ear infection but states she has taken amoxicillin multiple times over the years and has never had a reaction. No other new soaps, lotions or medications. She has tried benadryl w/o relief. Denies oral swelling or SOB. No nausea or vomiting.    Past Medical History:  Diagnosis Date  . Vitamin B12 deficiency   . Vitamin D deficiency     Patient Active Problem List   Diagnosis Date Noted  . Dyspareunia 03/04/2018  . Vaginal ulcer 03/04/2018  . Bilateral numbness and tingling of arms and legs 03/18/2016  . Migraine with aura and without status migrainosus, not intractable 03/18/2016  . Syncope 03/18/2016  . Vitamin B12 deficiency 03/04/2016  . Vitamin D insufficiency 03/04/2016  . Kidney stone 07/14/2015    Past Surgical History:  Procedure Laterality Date  . CESAREAN SECTION  2005, 2010  . WISDOM TOOTH EXTRACTION      OB History   No obstetric history on file.      Home Medications    Prior to Admission medications   Medication Sig Start Date End Date Taking? Authorizing Provider  amoxicillin (AMOXIL) 875 MG tablet Take 1 tablet (875 mg total) by mouth 2 (two) times daily. 07/09/18   Collene Gobble, MD  cetirizine (ZYRTEC) 10 MG tablet Take 1 tablet (10 mg total) by mouth daily. 07/11/18   Lurene Shadow, PA-C  levonorgestrel (MIRENA) 20 MCG/24HR IUD 1 each by Intrauterine route once.    [provider]  linaclotide Karlene Einstein) 145 MCG CAPS capsule Take 1 capsule (145 mcg total) by mouth daily before breakfast. Patient not taking: Reported on  03/18/2018 03/04/18   Sunnie Nielsen, DO  oseltamivir (TAMIFLU) 75 MG capsule Take 1 capsule (75 mg total) by mouth every 12 (twelve) hours. 07/09/18   Collene Gobble, MD  predniSONE (DELTASONE) 50 MG tablet Take 1 tablet (50 mg total) by mouth daily with breakfast for 5 days. 07/11/18 07/16/18  Lurene Shadow, PA-C  triamcinolone cream (KENALOG) 0.1 % Apply 1 application topically 2 (two) times daily. 07/11/18   Lurene Shadow, PA-C  Vitamin D, Ergocalciferol, (DRISDOL) 50000 units CAPS capsule Take 1 capsule (50,000 Units total) by mouth every 7 (seven) days. Take for 8 total doses(weeks) 03/05/18   Sunnie Nielsen, DO    Family History Family History  Problem Relation Age of Onset  . Diabetes Mother   . Hypertension Mother   . Thyroid disease Mother   . Diabetes Father   . Hypertension Father   . Emphysema Father   . Heart attack Father   . Breast cancer Maternal Grandmother   . Diabetes Maternal Grandmother     Social History Social History   Tobacco Use  . Smoking status: Never Smoker  . Smokeless tobacco: Never Used  Substance Use Topics  . Alcohol use: No  . Drug use: No     Allergies   Codeine and Tamiflu [oseltamivir phosphate]   Review of Systems Review of Systems  HENT: Negative for trouble swallowing.   Respiratory: Negative for shortness of breath and wheezing.   Musculoskeletal: Negative for arthralgias, joint swelling and myalgias.  Skin: Positive for rash. Negative for wound.  Neurological: Negative for dizziness, light-headedness and headaches.     Physical Exam Triage Vital Signs ED Triage Vitals  Enc Vitals Group     BP 07/11/18 1114 110/77     Pulse Rate 07/11/18 1114 70     Resp --      Temp 07/11/18 1114 98.6 F (37 C)     Temp Source 07/11/18 1114 Oral     SpO2 07/11/18 1114 99 %     Weight 07/11/18 1115 151 lb 4 oz (68.6 kg)     Height 07/11/18 1115  (1.549 m)     Head Circumference --      Peak Flow --      Pain Score  07/11/18 1115 0     Pain Loc --      Pain Edu? --      Excl. in GC? --    No data found.  Updated Vital Signs BP 110/77 (BP Location: Right Arm)   Pulse 70   Temp 98.6 F (37 C) (Oral)   Ht  (1.549 m)   Wt 151 lb 4 oz (68.6 kg)   LMP 06/29/2018   SpO2 99%   BMI 28.58 kg/m   Visual Acuity Right Eye Distance:   Left Eye Distance:   Bilateral Distance:    Right Eye Near:   Left Eye Near:    Bilateral Near:     Physical Exam Vitals signs and nursing note reviewed.  Constitutional:      Appearance: She is well-developed.  HENT:     Head: Normocephalic and atraumatic.     Right Ear: A middle ear effusion is present.     Nose: Nose normal.     Mouth/Throat:     Lips: Pink.     Mouth: Mucous membranes are moist.     Pharynx: Oropharynx is clear. Uvula midline. No pharyngeal swelling or uvula swelling.  Neck:     Musculoskeletal: Normal range of motion.  Cardiovascular:     Rate and Rhythm: Normal rate and regular rhythm.  Pulmonary:     Effort: Pulmonary effort is normal.     Breath sounds: Normal breath sounds.  Musculoskeletal: Normal range of motion.  Skin:    General: Skin is warm and dry.     Findings: Rash present.     Comments: Diffuse erythematous papular rash on trunk, arms and legs  Neurological:     Mental Status: She is alert and oriented to person, place, and time.  Psychiatric:        Behavior: Behavior normal.      UC Treatments / Results  Labs (all labs ordered are listed, but only abnormal results are displayed) Labs Reviewed - No data to display  EKG None  Radiology No results found.  Procedures Procedures (including critical care time)  Medications Ordered in UC Medications  methylPREDNISolone sodium succinate (SOLU-MEDROL) 40 mg/mL injection 80 mg (80 mg Intramuscular Given 07/11/18 1131)    Initial Impression / Assessment and Plan / UC Course  I have reviewed the triage vital signs and the nursing notes.  Pertinent labs  & imaging results that were available during my care of the patient were reviewed by me and considered in my medical decision making (see chart for details).     Hx  and exam c/w drug rash Will tx with steroids Home care info provided  Final Clinical Impressions(s) / UC Diagnoses   Final diagnoses:  Allergic reaction, initial encounter  Hives     Discharge Instructions      Please follow up with family medicine in 3-4 days if not improving, sooner if significantly worsening or call 911/go to the hospital.    ED Prescriptions    Medication Sig Dispense Auth. Provider   predniSONE (DELTASONE) 50 MG tablet Take 1 tablet (50 mg total) by mouth daily with breakfast for 5 days. 5 tablet Waylan Rocher O, PA-C   triamcinolone cream (KENALOG) 0.1 % Apply 1 application topically 2 (two) times daily. 30 g Lurene Shadow, PA-C   cetirizine (ZYRTEC) 10 MG tablet Take 1 tablet (10 mg total) by mouth daily. 30 tablet Lurene Shadow, PA-C     Controlled Substance Prescriptions [place] Controlled Substance Registry consulted? Not Applicable   Rolla Plate 07/11/18 1234

## 2018-07-11 NOTE — Discharge Instructions (Signed)
°  Please follow up with family medicine in 3-4 days if not improving, sooner if significantly worsening or call 911/go to the hospital.

## 2018-07-11 NOTE — ED Triage Notes (Signed)
Patient seen here given Tamiflu, since taking Tamiflu, has broken out in hives on arms and body.  Been taking Benadryl, no relief and itching.

## 2018-08-05 ENCOUNTER — Other Ambulatory Visit: Payer: Self-pay

## 2018-08-05 ENCOUNTER — Encounter: Payer: Self-pay | Admitting: Emergency Medicine

## 2018-08-05 ENCOUNTER — Emergency Department (INDEPENDENT_AMBULATORY_CARE_PROVIDER_SITE_OTHER)
Admission: EM | Admit: 2018-08-05 | Discharge: 2018-08-05 | Disposition: A | Discharge status: Home / Self Care | Source: Home / Self Care | Attending: Family Medicine | Admitting: Family Medicine

## 2018-08-05 DIAGNOSIS — B9789 Other viral agents as the cause of diseases classified elsewhere: Secondary | ICD-10-CM

## 2018-08-05 DIAGNOSIS — J069 Acute upper respiratory infection, unspecified: Secondary | ICD-10-CM | POA: Diagnosis not present

## 2018-08-05 MED ORDER — AZITHROMYCIN 250 MG PO TABS: ORAL_TABLET | ORAL | 0 refills | Status: DC

## 2018-08-05 NOTE — Discharge Instructions (Addendum)
Take plain guaifenesin (1200mg extended release tabs such as Mucinex) twice daily, with plenty of water, for cough and congestion.  May add Pseudoephedrine (30mg, one or two every 4 to 6 hours) for sinus congestion.  Get adequate rest.   °May use Afrin nasal spray (or generic oxymetazoline) each morning for about 5 days and then discontinue.  Also recommend using saline nasal spray several times daily and saline nasal irrigation (AYR is a common brand).  Use Flonase nasal spray each morning after using Afrin nasal spray and saline nasal irrigation. °Try warm salt water gargles for sore throat.  °Stop all antihistamines for now, and other non-prescription cough/cold preparations. °May take Delsym Cough Suppressant at bedtime for nighttime cough.  °Begin Azithromycin if not improving about one week or if persistent fever develops.  °

## 2018-08-05 NOTE — ED Triage Notes (Signed)
Cough and sore throat started yesterday. She said said she thinks its allergies, but needs a return to work note.

## 2018-08-05 NOTE — ED Provider Notes (Signed)
[facility]    CSN: [id number] Arrival date & time: 08/05/18  1534     History   Chief Complaint Chief Complaint  Patient presents with  . Cough    HPI [name] is a 38 y.o. female.   Patient complains of four day history of typical cold-like symptoms developing over several days, including mild sore throat, sinus congestion, fatigue, and cough.  She does not feel ill and thought that her symptoms were caused by a flare-up of her usual seasonal rhinitis.  She states that her ears feel full; she has a history of recurrent otitis media.  The history is provided by the patient.    Past Medical History:  Diagnosis Date  . Vitamin B12 deficiency   . Vitamin D deficiency     Patient Active Problem List   Diagnosis Date Noted  . Dyspareunia 03/04/2018  . Vaginal ulcer 03/04/2018  . Bilateral numbness and tingling of arms and legs 03/18/2016  . Migraine with aura and without status migrainosus, not intractable 03/18/2016  . Syncope 03/18/2016  . Vitamin B12 deficiency 03/04/2016  . Vitamin D insufficiency 03/04/2016  . Kidney stone 07/14/2015    Past Surgical History:  Procedure Laterality Date  . CESAREAN SECTION  2005, 2010  . WISDOM TOOTH EXTRACTION      OB History   No obstetric history on file.      Home Medications    Prior to Admission medications   Medication Sig Start Date End Date Taking? Authorizing Provider  azithromycin (ZITHROMAX Z-PAK) 250 MG tablet Take 2 tabs today; then begin one tab once daily for 4 more days. (Rx void after 08/12/18) 08/05/18   Lattie Haw, MD  cetirizine (ZYRTEC) 10 MG tablet Take 1 tablet (10 mg total) by mouth daily. 07/11/18   Lurene Shadow, PA-C  linaclotide [facility]) 145 MCG CAPS capsule Take 1 capsule (145 mcg total) by mouth daily before breakfast. Patient not taking: Reported on 03/18/2018 03/04/18   Sunnie Nielsen, DO  triamcinolone cream (KENALOG) 0.1 % Apply 1 application topically 2 (two) times  daily. 07/11/18   Lurene Shadow, PA-C  Vitamin D, Ergocalciferol, (DRISDOL) 50000 units CAPS capsule Take 1 capsule (50,000 Units total) by mouth every 7 (seven) days. Take for 8 total doses(weeks) 03/05/18   Sunnie Nielsen, DO    Family History Family History  Problem Relation Age of Onset  . Diabetes Mother   . Hypertension Mother   . Thyroid disease Mother   . Diabetes Father   . Hypertension Father   . Emphysema Father   . Heart attack Father   . Breast cancer Maternal Grandmother   . Diabetes Maternal Grandmother     Social History Social History   Tobacco Use  . Smoking status: Never Smoker  . Smokeless tobacco: Never Used  Substance Use Topics  . Alcohol use: No  . Drug use: No     Allergies   Codeine; Tamiflu [oseltamivir phosphate]; and Amoxicillin   Review of Systems Review of Systems + sore throat + cough No pleuritic pain No wheezing + nasal congestion + post-nasal drainage No sinus pain/pressure No itchy/red eyes ? earache No hemoptysis No SOB No fever/ ? chills No nausea No vomiting No abdominal pain No diarrhea No urinary symptoms No skin rash + fatigue No myalgias No headache Used OTC meds without relief   Physical Exam Triage Vital Signs ED Triage Vitals  Enc Vitals Group     BP 08/05/18 1549  123/78     Pulse Rate 08/05/18 1549 63     Resp --      Temp 08/05/18 1549 98.3 F (36.8 C)     Temp Source 08/05/18 1549 Oral     SpO2 08/05/18 1549 99 %     Weight 08/05/18 1550 150 lb (68 kg)     Height 08/05/18 1550 5\' 1"  (1.549 m)     Head Circumference --      Peak Flow --      Pain Score 08/05/18 1550 3     Pain Loc --      Pain Edu? --      Excl. in GC? --    No data found.  Updated Vital Signs BP 123/78 (BP Location: Right Arm)   Pulse 63   Temp 98.3 F (36.8 C) (Oral)   Ht 5\' 1"  (1.549 m)   Wt 68 kg   SpO2 99%   BMI 28.34 kg/m   Visual Acuity Right Eye Distance:   Left Eye Distance:   Bilateral Distance:     Right Eye Near:   Left Eye Near:    Bilateral Near:     Physical Exam Nursing notes and Vital Signs reviewed. Appearance:  Patient appears stated age, and in no acute distress Eyes:  Pupils are equal, round, and reactive to light and accomodation.  Extraocular movement is intact.  Conjunctivae are not inflamed  Ears:  Canals normal.  Tympanic membranes scarred but otherwise normal Nose:  Congested turbinates.  No sinus tenderness.   Pharynx:  Normal Neck:  Supple.  Enlarged posterior/lateral nodes are palpated bilaterally, tender to palpation on the left.   Lungs:  Clear to auscultation.  Breath sounds are equal.  Moving air well. Heart:  Regular rate and rhythm without murmurs, rubs, or gallops.  Abdomen:  Nontender without masses or hepatosplenomegaly.  Bowel sounds are present.  No CVA or flank tenderness.  Extremities:  No edema.  Skin:  No rash present.    UC Treatments / Results  Labs (all labs ordered are listed, but only abnormal results are displayed) Labs Reviewed - No data to display  EKG None  Radiology No results found.  Procedures Procedures (including critical care time)  Medications Ordered in UC Medications - No data to display  Initial Impression / Assessment and Plan / UC Course  I have reviewed the triage vital signs and the nursing notes.  Pertinent labs & imaging results that were available during my care of the patient were reviewed by me and considered in my medical decision making (see chart for details).    There is no evidence of bacterial infection today.  Treat symptomatically for now  Call Family Doctor if not improved in about 10 days   Final Clinical Impressions(s) / UC Diagnoses   Final diagnoses:  Viral URI with cough     Discharge Instructions     Take plain guaifenesin (1200mg  extended release tabs such as Mucinex) twice daily, with plenty of water, for cough and congestion.  May add Pseudoephedrine (30mg , one or two every 4  to 6 hours) for sinus congestion.  Get adequate rest.   May use Afrin nasal spray (or generic oxymetazoline) each morning for about 5 days and then discontinue.  Also recommend using saline nasal spray several times daily and saline nasal irrigation (AYR is a common brand).  Use Flonase nasal spray each morning after using Afrin nasal spray and saline nasal irrigation. Try warm salt water  gargles for sore throat.  Stop all antihistamines for now, and other non-prescription cough/cold preparations. May take Delsym Cough Suppressant at bedtime for nighttime cough.  Begin Azithromycin if not improving about one week or if persistent fever develops (given Rx to hold)       ED Prescriptions    Medication Sig Dispense Auth. Provider   azithromycin (ZITHROMAX Z-PAK) 250 MG tablet Take 2 tabs today; then begin one tab once daily for 4 more days. (Rx void after 08/12/18) 6 tablet Lattie Haw, MD         Lattie Haw, MD 08/05/18 [phone]

## 2018-09-07 ENCOUNTER — Encounter: Payer: Self-pay | Admitting: Osteopathic Medicine

## 2018-09-07 ENCOUNTER — Ambulatory Visit (INDEPENDENT_AMBULATORY_CARE_PROVIDER_SITE_OTHER): Admitting: Osteopathic Medicine

## 2018-09-07 VITALS — Wt 149.8 lb

## 2018-09-07 DIAGNOSIS — Z8639 Personal history of other endocrine, nutritional and metabolic disease: Secondary | ICD-10-CM | POA: Diagnosis not present

## 2018-09-07 DIAGNOSIS — E559 Vitamin D deficiency, unspecified: Secondary | ICD-10-CM | POA: Diagnosis not present

## 2018-09-07 DIAGNOSIS — R5382 Chronic fatigue, unspecified: Secondary | ICD-10-CM | POA: Diagnosis not present

## 2018-09-07 DIAGNOSIS — F419 Anxiety disorder, unspecified: Secondary | ICD-10-CM

## 2018-09-07 NOTE — Progress Notes (Signed)
Virtual Visit via Video (App used: Doximity) Note  I connected with      [name] on 09/07/18 at 1:00 by a telemedicine application and verified that I am speaking with the correct person using two identifiers.  Patient is at home I am working from home    I discussed the limitations of evaluation and management by telemedicine and the availability of in person appointments. The patient expressed understanding and agreed to proceed.  History of Present Illness: [name] is a 38 y.o. female who would like to discuss  Chief Complaint  Patient presents with  . Follow-up fatigue, b12, vitamin D New problem: anxiety    At last visit 03/2018 discussed fatigue, constipation, B12 and vitamin D history of deficiency. Labs were ordered for recheck B12 and D but never collected.    Anxiety: out of work (COVID pandemic) layoff Was working from home for [employer]ile, nor taking care of kids.  Doesn't feel need for medications or counseling at this time.   Fatigue has actually gotten a bit better. Taking walks more often. Bloating/swelling feeling has improved.   The Linzess seems to have worked ok but she only needs it every now and then, taking prn and no complaints.        Observations/Objective: Wt 149 lb 12.8 oz (67.9 kg)   BMI 28.30 kg/m  BP Readings from Last 3 Encounters:  08/05/18 123/78  07/11/18 110/77  07/09/18 94/65   Exam: Normal Speech.  Normal affect, NAD   Lab and Radiology Results No results found for this or any previous visit (from the past 72 hour(s)). No results found.     Assessment and Plan: 38 y.o. female with The primary encounter diagnosis was Anxiety. Diagnoses of History of non anemic vitamin B12 deficiency, Vitamin D deficiency, and Chronic fatigue were also pertinent to this visit.  Doing okay overall  Will revisit the anxiety issue as needed  OK to continue as-is depending on lab results  Plan for routine annual 02/2019  PDMP not  reviewed this encounter. Orders Placed This Encounter  Procedures  . VITAMIN D 25 Hydroxy (Vit-D Deficiency, Fractures)  . Vitamin B12   No orders of the defined types were placed in this encounter.  There are no Patient Instructions on file for this visit.  Instructions sent via MyChart. If MyChart not available, pt was given option for info via personal e-mail w/ no guarantee of protected health info over unsecured e-mail communication, and MyChart sign-up instructions were included.   Follow Up Instructions: Return for annual physical arounf late 02/2019, see me sooner as needed .    I discussed the assessment and treatment plan with the patient. The patient was provided an opportunity to ask questions and all were answered. The patient agreed with the plan and demonstrated an understanding of the instructions.   The patient was advised to call back or seek an in-person evaluation if any new concerns, if symptoms worsen or if the condition fails to improve as anticipated.  15 minutes of non-face-to-face time was provided during this encounter.                      Historical information moved to improve visibility of documentation.  Past Medical History:  Diagnosis Date  . Vitamin B12 deficiency   . Vitamin D deficiency    Past Surgical History:  Procedure Laterality Date  . CESAREAN SECTION  2005, 2010  . WISDOM TOOTH  EXTRACTION     Social History   Tobacco Use  . Smoking status: Never Smoker  . Smokeless tobacco: Never Used  Substance Use Topics  . Alcohol use: No   family history includes Breast cancer in her maternal grandmother; Diabetes in her father, maternal grandmother, and mother; Emphysema in her father; Heart attack in her father; Hypertension in her father and mother; Thyroid disease in her mother.  Medications: Current Outpatient Medications  Medication Sig Dispense Refill  . Cyanocobalamin (VITAMIN B-12) 2500 MCG SUBL Place under the  tongue.    . linaclotide (LINZESS) 145 MCG CAPS capsule Take 1 capsule (145 mcg total) by mouth daily before breakfast. 30 capsule 2  . loratadine (CLARITIN) 10 MG tablet Take 10 mg by mouth daily.    Marland Kitchen VITAMIN D PO Take 2,000 Units by mouth.    . Vitamin D, Ergocalciferol, (DRISDOL) 50000 units CAPS capsule Take 1 capsule (50,000 Units total) by mouth every 7 (seven) days. Take for 8 total doses(weeks) 8 capsule 0  . azithromycin (ZITHROMAX Z-PAK) 250 MG tablet Take 2 tabs today; then begin one tab once daily for 4 more days. (Rx void after 08/12/18) (Patient not taking: Reported on 09/07/2018) 6 tablet 0  . cetirizine (ZYRTEC) 10 MG tablet Take 1 tablet (10 mg total) by mouth daily. (Patient not taking: Reported on 09/07/2018) 30 tablet 0  . triamcinolone cream (KENALOG) 0.1 % Apply 1 application topically 2 (two) times daily. (Patient not taking: Reported on 09/07/2018) 30 g 0   No current facility-administered medications for this visit.    Allergies  Allergen Reactions  . Codeine Nausea And Vomiting    GI upset  . Tamiflu [Oseltamivir Phosphate] Hives  . Amoxicillin     PDMP not reviewed this encounter. Orders Placed This Encounter  Procedures  . VITAMIN D 25 Hydroxy (Vit-D Deficiency, Fractures)  . Vitamin B12   No orders of the defined types were placed in this encounter.

## 2019-01-18 ENCOUNTER — Emergency Department (INDEPENDENT_AMBULATORY_CARE_PROVIDER_SITE_OTHER)
Admission: EM | Admit: 2019-01-18 | Discharge: 2019-01-18 | Disposition: A | Discharge status: Home / Self Care | Source: Home / Self Care

## 2019-01-18 ENCOUNTER — Other Ambulatory Visit: Payer: Self-pay

## 2019-01-18 DIAGNOSIS — R42 Dizziness and giddiness: Secondary | ICD-10-CM | POA: Diagnosis not present

## 2019-01-18 DIAGNOSIS — H65191 Other acute nonsuppurative otitis media, right ear: Secondary | ICD-10-CM

## 2019-01-18 DIAGNOSIS — H8110 Benign paroxysmal vertigo, unspecified ear: Secondary | ICD-10-CM

## 2019-01-18 DIAGNOSIS — O219 Vomiting of pregnancy, unspecified: Secondary | ICD-10-CM

## 2019-01-18 LAB — POCT URINALYSIS DIP (MANUAL ENTRY)
Bilirubin, UA: NEGATIVE
Blood, UA: NEGATIVE
Glucose, UA: NEGATIVE mg/dL
Ketones, POC UA: NEGATIVE mg/dL
Nitrite, UA: NEGATIVE
Protein Ur, POC: NEGATIVE mg/dL
Spec Grav, UA: 1.02 (ref 1.010–1.025)
Urobilinogen, UA: 2 E.U./dL — AB
pH, UA: 7.5 (ref 5.0–8.0)

## 2019-01-18 MED ORDER — MECLIZINE HCL 25 MG PO TABS: 25.0000 mg | ORAL_TABLET | Freq: Three times a day (TID) | ORAL | 0 refills | Status: DC | PRN

## 2019-01-18 MED ORDER — AMOXICILLIN 500 MG PO CAPS: 500.0000 mg | ORAL_CAPSULE | Freq: Three times a day (TID) | ORAL | 0 refills | Status: DC

## 2019-01-18 NOTE — Discharge Instructions (Signed)
°  Meclizine is being prescribed to help with nausea and dizziness. This medication typically is not prescribed until after 13 weeks but due to the severity of your symptoms, the OB/GYN in [place] as suggested you try the dose prescribed to help with your symptoms.  You may try the meclizine as prescribed if you have severe dizziness or unable to keep down fluids.  Otherwise, it is recommended you try to rest, stay well hydrated and follow up with your OB/GYN tomorrow as recommended.  If symptoms become too severe before then, please call 911 or have someone drive you to [facility] for further evaluation and treatment as you may need IV fluids and additional medications.

## 2019-01-18 NOTE — ED Triage Notes (Signed)
Pt is [redacted] weeks pregnant, and starting last week, was very dizzy, and nauseated.  Had ear pain last night.

## 2019-01-18 NOTE — ED Provider Notes (Signed)
[facility]    CSN: [id number] Arrival date & time: 01/18/19  1532      History   Chief Complaint Chief Complaint  Patient presents with  . Dizziness  . Nausea    HPI Miangel Clinton SawyerR Kimmons is a 38 y.o. female.   HPI  Taraoluwa R Kizzie BaneHughes is a 38 y.o. female presenting to UC with c/o 1 week of gradually worsening dizziness described as room spinning, associated nausea and vomiting. Dizziness is described as room spinning, worse with certain movements, associated ear pressure.  Pt is [redacted] weeks pregnant and has her first prenatal appointment with her OB/GYN tomorrow but she has vomited 5 times today, she would like some relief before then.  Denies fever, chills, cough, congestion, abdominal pain, vaginal bleeding or discharge, or urinary symptoms.  This pregnancy was not planned but it is also not her first pregnancy. She does not recall prior pregnancies causing dizziness like this.  She is taking gummy prenatal vitamins but states sometimes she cannot keep down the vitamin due to nausea and vomiting. No other new medications or supplements.    Past Medical History:  Diagnosis Date  . Vitamin B12 deficiency   . Vitamin D deficiency     Patient Active Problem List   Diagnosis Date Noted  . Dyspareunia 03/04/2018  . Vaginal ulcer 03/04/2018  . Bilateral numbness and tingling of arms and legs 03/18/2016  . Migraine with aura and without status migrainosus, not intractable 03/18/2016  . Syncope 03/18/2016  . Vitamin B12 deficiency 03/04/2016  . Vitamin D insufficiency 03/04/2016  . Kidney stone 07/14/2015    Past Surgical History:  Procedure Laterality Date  . CESAREAN SECTION  2005, 2010  . WISDOM TOOTH EXTRACTION      OB History    Gravida  1   Para      Term      Preterm      AB      Living        SAB      TAB      Ectopic      Multiple      Live Births               Home Medications    Prior to Admission medications   Medication Sig Start Date  End Date Taking? Authorizing Provider  amoxicillin (AMOXIL) 500 MG capsule Take 1 capsule (500 mg total) by mouth 3 (three) times daily. 01/18/19   Lurene ShadowPhelps, Zyah Gomm O, PA-C  azithromycin (ZITHROMAX Z-PAK) 250 MG tablet Take 2 tabs today; then begin one tab once daily for 4 more days. (Rx void after 08/12/18) Patient not taking: Reported on 09/07/2018 08/05/18   Lattie HawBeese, Stephen A, MD  cetirizine (ZYRTEC) 10 MG tablet Take 1 tablet (10 mg total) by mouth daily. Patient not taking: Reported on 09/07/2018 07/11/18   Lurene ShadowPhelps, Naomi Fitton O, PA-C  Cyanocobalamin (VITAMIN B-12) 2500 MCG SUBL Place under the tongue.    [provider]  linaclotide Karlene Einstein(LINZESS) 145 MCG CAPS capsule Take 1 capsule (145 mcg total) by mouth daily before breakfast. 03/04/18   Sunnie NielsenAlexander, Natalie, DO  loratadine (CLARITIN) 10 MG tablet Take 10 mg by mouth daily.    [provider]  meclizine (ANTIVERT) 25 MG tablet Take 1 tablet (25 mg total) by mouth 3 (three) times daily as needed for dizziness or nausea. 01/18/19   Lurene ShadowPhelps, Dayln Tugwell O, PA-C  triamcinolone cream (KENALOG) 0.1 % Apply 1 application topically 2 (two) times daily.  Patient not taking: Reported on 09/07/2018 07/11/18   Lurene Shadow, PA-C  VITAMIN D PO Take 2,000 Units by mouth.    [provider]  Vitamin D, Ergocalciferol, (DRISDOL) 50000 units CAPS capsule Take 1 capsule (50,000 Units total) by mouth every 7 (seven) days. Take for 8 total doses(weeks) 03/05/18   Sunnie Nielsen, DO    Family History Family History  Problem Relation Age of Onset  . Diabetes Mother   . Hypertension Mother   . Thyroid disease Mother   . Diabetes Father   . Hypertension Father   . Emphysema Father   . Heart attack Father   . Breast cancer Maternal Grandmother   . Diabetes Maternal Grandmother     Social History Social History   Tobacco Use  . Smoking status: Never Smoker  . Smokeless tobacco: Never Used  Substance Use Topics  . Alcohol use: No  . Drug use: No      Allergies   Codeine, Tamiflu [oseltamivir phosphate], and Amoxicillin   Review of Systems Review of Systems  Constitutional: Negative for chills and fever.  HENT: Positive for ear pain (bilateral pressure). Negative for congestion, sore throat, trouble swallowing and voice change.   Respiratory: Negative for cough and shortness of breath.   Cardiovascular: Negative for chest pain and palpitations.  Gastrointestinal: Positive for nausea and vomiting. Negative for abdominal pain and diarrhea.  Musculoskeletal: Negative for arthralgias, back pain and myalgias.  Skin: Negative for rash.  Neurological: Positive for dizziness. Negative for syncope, light-headedness and headaches.     Physical Exam Triage Vital Signs ED Triage Vitals  Enc Vitals Group     BP 01/18/19 1606 125/80     Pulse Rate 01/18/19 1606 71     Resp 01/18/19 1606 18     Temp 01/18/19 1606 98.2 F (36.8 C)     Temp Source 01/18/19 1606 Oral     SpO2 01/18/19 1606 100 %     Weight 01/18/19 1608 150 lb (68 kg)     Height 01/18/19 1608 5\' 1"  (1.549 m)     Head Circumference --      Peak Flow --      Pain Score 01/18/19 1607 2     Pain Loc --      Pain Edu? --      Excl. in GC? --    Orthostatic VS for the past 24 hrs:  BP- Lying Pulse- Lying BP- Sitting Pulse- Sitting BP- Standing at 0 minutes Pulse- Standing at 0 minutes  01/18/19 1717 106/73 71 109/73 80 111/72 74    Updated Vital Signs BP 125/80 (BP Location: Right Arm)   Pulse 71   Temp 98.2 F (36.8 C) (Oral)   Resp 18   Ht 5\' 1"  (1.549 m)   Wt 150 lb (68 kg)   LMP 11/26/2018   SpO2 100%   BMI 28.34 kg/m     Physical Exam Vitals signs and nursing note reviewed.  Constitutional:      Appearance: Normal appearance. She is well-developed.  HENT:     Head: Normocephalic and atraumatic.     Right Ear: Ear canal normal. A middle ear effusion is present. Tympanic membrane is not erythematous or bulging.     Left Ear: Ear canal normal.  No middle  ear effusion. Tympanic membrane is not erythematous or bulging.     Nose: Nose normal.     Right Sinus: No maxillary sinus tenderness or frontal sinus tenderness.  Left Sinus: No maxillary sinus tenderness or frontal sinus tenderness.     Mouth/Throat:     Lips: Pink.     Mouth: Mucous membranes are moist.     Pharynx: Oropharynx is clear. Uvula midline.  Eyes:     Extraocular Movements: Extraocular movements intact.     Conjunctiva/sclera: Conjunctivae normal.     Pupils: Pupils are equal, round, and reactive to light.  Neck:     Musculoskeletal: Normal range of motion.  Cardiovascular:     Rate and Rhythm: Normal rate and regular rhythm.  Pulmonary:     Effort: Pulmonary effort is normal. No respiratory distress.     Breath sounds: Normal breath sounds. No stridor. No wheezing, rhonchi or rales.  Abdominal:     Palpations: Abdomen is soft.     Tenderness: There is no abdominal tenderness.  Musculoskeletal: Normal range of motion.  Skin:    General: Skin is warm and dry.     Capillary Refill: Capillary refill takes less than 2 seconds.  Neurological:     General: No focal deficit present.     Mental Status: She is alert and oriented to person, place, and time.  Psychiatric:        Behavior: Behavior normal.      UC Treatments / Results  Labs (all labs ordered are listed, but only abnormal results are displayed) Labs Reviewed  POCT URINALYSIS DIP (MANUAL ENTRY) - Abnormal; Notable for the following components:      Result Value   Urobilinogen, UA 2.0 (*)    Leukocytes, UA Trace (*)    All other components within normal limits  URINE CULTURE    EKG   Radiology No results found.  Procedures Procedures (including critical care time)  Medications Ordered in UC Medications - No data to display  Initial Impression / Assessment and Plan / UC Course  I have reviewed the triage vital signs and the nursing notes.  Pertinent labs & imaging results that were  available during my care of the patient were reviewed by me and considered in my medical decision making (see chart for details).     UA: no definite UTI, culture pending. Ear exam c/w Right AOM with effusion, likely the culprit of reported dizziness/vertigo. Discussed short trial of meclizine for pt given severity of dizziness and unable to keep down fluids, consulted with on-call OB in [place], agreeable for pt to start with 25mg  every 6 hours.  Provided prescription for 3 tabs for pt to take every 8 hours to get her through until f/u with her OB on 01/19/2019.  Orthostatic vitals: BP lower than [facility]  Offered IV fluids, pt would like to try oral hydration at home and f/u with OB/GYN tomorrow for fluids if needed. Pt's husband drove her to UC, she feels safe being discharged home. Encouraged rest and good hydration. Discussed symptoms that warrant emergent care in the ED.  Final Clinical Impressions(s) / UC Diagnoses   Final diagnoses:  Dizziness  Benign paroxysmal positional vertigo, unspecified laterality  Acute otitis media with effusion of right ear  Nausea and vomiting during pregnancy     Discharge Instructions      Meclizine is being prescribed to help with nausea and dizziness. This medication typically is not prescribed until after 13 weeks but due to the severity of your symptoms, the OB/GYN in [place] as suggested you try the dose prescribed to help with your symptoms.  You may try the  meclizine as prescribed if you have severe dizziness or unable to keep down fluids.  Otherwise, it is recommended you try to rest, stay well hydrated and follow up with your OB/GYN tomorrow as recommended.  If symptoms become too severe before then, please call 911 or have someone drive you to [facility] for further evaluation and treatment as you may need IV fluids and additional medications.     ED Prescriptions    Medication Sig Dispense  Auth. Provider   amoxicillin (AMOXIL) 500 MG capsule Take 1 capsule (500 mg total) by mouth 3 (three) times daily. 21 capsule Gerarda Fraction, Kalii Chesmore O, PA-C   meclizine (ANTIVERT) 25 MG tablet Take 1 tablet (25 mg total) by mouth 3 (three) times daily as needed for dizziness or nausea. 3 tablet Noe Gens, PA-C     Controlled Substance Prescriptions [place] Controlled Substance Registry consulted? Not Applicable   Tyrell Antonio 01/19/19 0840

## 2019-01-20 LAB — URINE CULTURE
MICRO NUMBER:: 878546
SPECIMEN QUALITY:: ADEQUATE

## 2019-02-01 LAB — OB RESULTS CONSOLE GC/CHLAMYDIA
Chlamydia: NEGATIVE
Gonorrhea: NEGATIVE

## 2019-02-01 LAB — OB RESULTS CONSOLE RUBELLA ANTIBODY, IGM: Rubella: IMMUNE

## 2019-02-01 LAB — OB RESULTS CONSOLE ABO/RH: RH Type: POSITIVE

## 2019-02-01 LAB — OB RESULTS CONSOLE HIV ANTIBODY (ROUTINE TESTING): HIV: NONREACTIVE

## 2019-02-01 LAB — OB RESULTS CONSOLE RPR: RPR: NONREACTIVE

## 2019-02-01 LAB — OB RESULTS CONSOLE HEPATITIS B SURFACE ANTIGEN: Hepatitis B Surface Ag: NEGATIVE

## 2019-02-01 LAB — OB RESULTS CONSOLE ANTIBODY SCREEN: Antibody Screen: NEGATIVE

## 2019-08-10 ENCOUNTER — Encounter (HOSPITAL_COMMUNITY): Payer: Self-pay | Admitting: *Deleted

## 2019-08-10 NOTE — Patient Instructions (Addendum)
Rifka NOAH PELAEZ  08/10/2019   Your procedure is scheduled on:  08/26/2019  Arrive at 0800 at Entrance C on [address] at [facility]  and [employer]. You are invited to use the FREE valet parking or use the Visitor's parking deck.  Pick up the phone at the desk and dial [phone].  Call this number if you have problems the morning of surgery: [phone]  Remember:   Do not eat food:(After Midnight) Desps de medianoche.  Do not drink clear liquids: (After Midnight) Desps de medianoche.  Take these medicines the morning of surgery with A SIP OF WATER:  none   Do not wear jewelry, make-up or nail polish.  Do not wear lotions, powders, or perfumes. Do not wear deodorant.  Do not shave 48 hours prior to surgery.  Do not bring valuables to the hospital.  [facility] is not   responsible for any belongings or valuables brought to the hospital.  Contacts, dentures or bridgework may not be worn into surgery.  Leave suitcase in the car. After surgery it may be brought to your room.  For patients admitted to the hospital, checkout time is 11:00 AM the day of              discharge.      Please read over the following fact sheets that you were given:     Preparing for Surgery

## 2019-08-17 ENCOUNTER — Encounter (HOSPITAL_COMMUNITY): Payer: Self-pay

## 2019-08-24 ENCOUNTER — Other Ambulatory Visit: Payer: Self-pay

## 2019-08-24 ENCOUNTER — Other Ambulatory Visit (HOSPITAL_COMMUNITY)
Admission: RE | Admit: 2019-08-24 | Discharge: 2019-08-24 | Disposition: A | Discharge status: Home / Self Care | Source: Ambulatory Visit | Attending: Obstetrics and Gynecology | Admitting: Obstetrics and Gynecology

## 2019-08-24 DIAGNOSIS — Z01812 Encounter for preprocedural laboratory examination: Secondary | ICD-10-CM | POA: Insufficient documentation

## 2019-08-24 DIAGNOSIS — Z20822 Contact with and (suspected) exposure to covid-19: Secondary | ICD-10-CM | POA: Insufficient documentation

## 2019-08-24 HISTORY — DX: Personal history of urinary calculi: Z87.442

## 2019-08-24 HISTORY — DX: Personal history of other diseases of urinary system: Z87.448

## 2019-08-24 HISTORY — DX: Headache, unspecified: R51.9

## 2019-08-24 LAB — SARS CORONAVIRUS 2 (TAT 6-24 HRS): SARS Coronavirus 2: NEGATIVE

## 2019-08-24 LAB — CBC
HCT: 38.3 % (ref 36.0–46.0)
Hemoglobin: 12.7 g/dL (ref 12.0–15.0)
MCH: 31.2 pg (ref 26.0–34.0)
MCHC: 33.2 g/dL (ref 30.0–36.0)
MCV: 94.1 fL (ref 80.0–100.0)
Platelets: 221 10*3/uL (ref 150–400)
RBC: 4.07 MIL/uL (ref 3.87–5.11)
RDW: 13.7 % (ref 11.5–15.5)
WBC: 7.6 10*3/uL (ref 4.0–10.5)
nRBC: 0 % (ref 0.0–0.2)

## 2019-08-24 LAB — ABO/RH: ABO/RH(D): O POS

## 2019-08-24 LAB — TYPE AND SCREEN
ABO/RH(D): O POS
Antibody Screen: NEGATIVE

## 2019-08-24 NOTE — MAU Note (Signed)
Asymptomatic, swab collected. Lab called 

## 2019-08-25 LAB — RPR: RPR Ser Ql: NONREACTIVE

## 2019-08-25 NOTE — H&P (Signed)
[name] is a 39 y.o. female G3P1102 at 88 0/7 weeks (EDD 09/02/19 by LMP c/w 7 week [place])   presenting for scheduled repeat c-section with a history of 2 prior c-sections. Prenatal care also significant for AMA with low risk panorama and h/o PTD so she was on Makena weeks 16-36 of pregnancy.  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  OB History    Gravida  4   Para  2   Term  1   Preterm  1   AB      Living        SAB      TAB      Ectopic      Multiple      Live Births            07-18-2003, 35 wks F, 6lbs 13oz, Cesarean Section 09-16-2008, 39 wks M, 7lbs 13oz, Cesarean Section  Past Medical History:  Diagnosis Date  . Headache   . History of kidney stones   . History of pyelonephritis   . Vitamin B12 deficiency   . Vitamin D deficiency    Past Surgical History:  Procedure Laterality Date  . CESAREAN SECTION  2005, 2010  . WISDOM TOOTH EXTRACTION     Family History: family history includes Breast cancer in her maternal grandmother; Diabetes in her father, maternal grandmother, and mother; Emphysema in her father; Heart attack in her father; Hypertension in her father and mother; Liver disease in her father; Parkinson's disease in her father; Thyroid disease in her mother. Social History:  reports that she has never smoked. She has never used smokeless tobacco. She reports that she does not drink alcohol or use drugs.     Maternal Diabetes: No Genetic Screening: Normal Maternal Ultrasounds/Referrals: Normal Fetal Ultrasounds or other Referrals:  None Maternal Substance Abuse:  No Significant Maternal Medications:  None Significant Maternal Lab Results:  Group B Strep positive Other Comments:  None  Review of Systems  Constitutional: Negative for fever.  Gastrointestinal: Negative for abdominal pain.   Maternal Medical History:  Contractions: Frequency: irregular.   Perceived severity is mild.    Fetal activity: Perceived fetal activity is normal.    Prenatal  complications: AMA, h.o PTD  Prenatal Complications - Diabetes: none.      Last menstrual period 11/26/2018. Maternal Exam:  Uterine Assessment: Contraction strength is mild.  Contraction frequency is irregular.   Abdomen: Patient reports no abdominal tenderness. Fetal presentation: vertex  Introitus: Normal vulva. Normal vagina.    Physical Exam  Constitutional: She appears well-developed.  Cardiovascular: Normal rate and regular rhythm.  Respiratory: Effort normal.  GI: Soft.  Genitourinary:    Vulva and vagina normal.   Neurological: She is alert.  Psychiatric: She has a normal mood and affect.    Prenatal labs: ABO, Rh: --/--/O POS, O POS Performed at [facility], [address] [place], [place] [address]  [phone] 0913) Antibody: NEG (04/20 0913) Rubella: Immune (09/28 0000) RPR: NON REACTIVE (04/20 0913)  HBsAg: Negative (09/28 0000)  HIV: Non-reactive (09/28 0000)  GBS:   Positive One hour GCT 154 Three hour GTT WNL Panorama low risk   Assessment/Plan:  Reviewed the c-section procedure in detail and d/w pt risks and benefits of bleeding, infection and possible damage to bladder or bowel.  She is ready to proceed.  Oliver Pila 08/25/2019, 8:54 PM

## 2019-08-26 ENCOUNTER — Encounter (HOSPITAL_COMMUNITY)
Admission: RE | Disposition: A | Discharge status: Home / Self Care | Payer: Self-pay | Source: Home / Self Care | Attending: Obstetrics and Gynecology

## 2019-08-26 ENCOUNTER — Inpatient Hospital Stay (HOSPITAL_COMMUNITY): Admitting: Certified Registered Nurse Anesthetist

## 2019-08-26 ENCOUNTER — Inpatient Hospital Stay (HOSPITAL_COMMUNITY)
Admission: RE | Admit: 2019-08-26 | Discharge: 2019-08-29 | DRG: 788 | Disposition: A | Discharge status: Home / Self Care | Attending: Obstetrics and Gynecology | Admitting: Obstetrics and Gynecology

## 2019-08-26 ENCOUNTER — Other Ambulatory Visit: Payer: Self-pay

## 2019-08-26 ENCOUNTER — Encounter (HOSPITAL_COMMUNITY): Payer: Self-pay | Admitting: Obstetrics and Gynecology

## 2019-08-26 DIAGNOSIS — Z20822 Contact with and (suspected) exposure to covid-19: Secondary | ICD-10-CM | POA: Diagnosis present

## 2019-08-26 DIAGNOSIS — O99824 Streptococcus B carrier state complicating childbirth: Secondary | ICD-10-CM | POA: Diagnosis present

## 2019-08-26 DIAGNOSIS — Z98891 History of uterine scar from previous surgery: Secondary | ICD-10-CM

## 2019-08-26 DIAGNOSIS — Z3A39 39 weeks gestation of pregnancy: Secondary | ICD-10-CM | POA: Diagnosis not present

## 2019-08-26 DIAGNOSIS — O34211 Maternal care for low transverse scar from previous cesarean delivery: Secondary | ICD-10-CM | POA: Diagnosis present

## 2019-08-26 SURGERY — Surgical Case
Anesthesia: Spinal | Wound class: Clean Contaminated

## 2019-08-26 MED ORDER — COCONUT OIL OIL
1.0000 "application " | TOPICAL_OIL | Status: DC | PRN
  Administered 2019-08-29: 1 via TOPICAL

## 2019-08-26 MED ORDER — DIPHENHYDRAMINE HCL 50 MG/ML IJ SOLN
12.5000 mg | INTRAMUSCULAR | Status: DC | PRN
  Administered 2019-08-26: 13:00:00 12.5 mg via INTRAVENOUS

## 2019-08-26 MED ORDER — SCOPOLAMINE 1 MG/3DAYS TD PT72
MEDICATED_PATCH | TRANSDERMAL | Status: AC
  Filled 2019-08-26: qty 1

## 2019-08-26 MED ORDER — FENTANYL CITRATE (PF) 100 MCG/2ML IJ SOLN
INTRAMUSCULAR | Status: AC
  Filled 2019-08-26: qty 2

## 2019-08-26 MED ORDER — BUPIVACAINE IN DEXTROSE 0.75-8.25 % IT SOLN
INTRATHECAL | Status: DC | PRN
  Administered 2019-08-26: 1.6 mL via INTRATHECAL

## 2019-08-26 MED ORDER — LACTATED RINGERS IV SOLN: INTRAVENOUS | Status: DC

## 2019-08-26 MED ORDER — NALOXONE HCL 4 MG/10ML IJ SOLN
1.0000 ug/kg/h | INTRAVENOUS | Status: DC | PRN
  Filled 2019-08-26: qty 5

## 2019-08-26 MED ORDER — SIMETHICONE 80 MG PO CHEW
80.0000 mg | CHEWABLE_TABLET | ORAL | Status: DC | PRN
  Filled 2019-08-26: qty 1

## 2019-08-26 MED ORDER — SIMETHICONE 80 MG PO CHEW
80.0000 mg | CHEWABLE_TABLET | ORAL | Status: DC
  Administered 2019-08-26 – 2019-08-29 (×3): 80 mg via ORAL
  Filled 2019-08-26 (×4): qty 1

## 2019-08-26 MED ORDER — PRENATAL MULTIVITAMIN CH
1.0000 | ORAL_TABLET | Freq: Every day | ORAL | Status: DC
  Administered 2019-08-27 – 2019-08-29 (×3): 1 via ORAL
  Filled 2019-08-26 (×3): qty 1

## 2019-08-26 MED ORDER — PROMETHAZINE HCL 25 MG/ML IJ SOLN: 6.2500 mg | INTRAMUSCULAR | Status: DC | PRN

## 2019-08-26 MED ORDER — STERILE WATER FOR IRRIGATION IR SOLN
Status: DC | PRN
  Administered 2019-08-26: 1000 mL

## 2019-08-26 MED ORDER — HYDROMORPHONE HCL 1 MG/ML IJ SOLN: 0.2500 mg | INTRAMUSCULAR | Status: DC | PRN

## 2019-08-26 MED ORDER — TETANUS-DIPHTH-ACELL PERTUSSIS 5-2.5-18.5 LF-MCG/0.5 IM SUSP: 0.5000 mL | Freq: Once | INTRAMUSCULAR | Status: DC

## 2019-08-26 MED ORDER — SODIUM CHLORIDE 0.9 % IV SOLN: INTRAVENOUS | Status: DC | PRN

## 2019-08-26 MED ORDER — OXYTOCIN 40 UNITS IN NORMAL SALINE INFUSION - SIMPLE MED
INTRAVENOUS | Status: AC
  Filled 2019-08-26: qty 1000

## 2019-08-26 MED ORDER — ACETAMINOPHEN 325 MG PO TABS
650.0000 mg | ORAL_TABLET | ORAL | Status: DC | PRN
  Administered 2019-08-26 – 2019-08-29 (×5): 650 mg via ORAL
  Filled 2019-08-26 (×5): qty 2

## 2019-08-26 MED ORDER — OXYTOCIN 40 UNITS IN NORMAL SALINE INFUSION - SIMPLE MED: 2.5000 [IU]/h | INTRAVENOUS | Status: AC

## 2019-08-26 MED ORDER — SCOPOLAMINE 1 MG/3DAYS TD PT72
1.0000 | MEDICATED_PATCH | Freq: Once | TRANSDERMAL | Status: AC
  Administered 2019-08-26: 1 via TRANSDERMAL

## 2019-08-26 MED ORDER — MORPHINE SULFATE (PF) 0.5 MG/ML IJ SOLN
INTRAMUSCULAR | Status: AC
  Filled 2019-08-26: qty 10

## 2019-08-26 MED ORDER — KETOROLAC TROMETHAMINE 30 MG/ML IJ SOLN
INTRAMUSCULAR | Status: AC
  Filled 2019-08-26: qty 1

## 2019-08-26 MED ORDER — SODIUM CHLORIDE 0.9 % IR SOLN
Status: DC | PRN
  Administered 2019-08-26: 1000 mL

## 2019-08-26 MED ORDER — ONDANSETRON HCL 4 MG/2ML IJ SOLN
INTRAMUSCULAR | Status: AC
  Filled 2019-08-26: qty 2

## 2019-08-26 MED ORDER — DIPHENHYDRAMINE HCL 25 MG PO CAPS: 25.0000 mg | ORAL_CAPSULE | Freq: Four times a day (QID) | ORAL | Status: DC | PRN

## 2019-08-26 MED ORDER — DEXAMETHASONE SODIUM PHOSPHATE 4 MG/ML IJ SOLN
INTRAMUSCULAR | Status: AC
  Filled 2019-08-26: qty 1

## 2019-08-26 MED ORDER — NALBUPHINE HCL 10 MG/ML IJ SOLN: 5.0000 mg | INTRAMUSCULAR | Status: DC | PRN

## 2019-08-26 MED ORDER — OXYCODONE HCL 5 MG PO TABS
5.0000 mg | ORAL_TABLET | ORAL | Status: DC | PRN
  Administered 2019-08-27 – 2019-08-29 (×4): 5 mg via ORAL
  Filled 2019-08-26 (×4): qty 1

## 2019-08-26 MED ORDER — NALBUPHINE HCL 10 MG/ML IJ SOLN: 5.0000 mg | Freq: Once | INTRAMUSCULAR | Status: DC | PRN

## 2019-08-26 MED ORDER — MENTHOL 3 MG MT LOZG: 1.0000 | LOZENGE | OROMUCOSAL | Status: DC | PRN

## 2019-08-26 MED ORDER — OXYCODONE HCL 5 MG PO TABS: 5.0000 mg | ORAL_TABLET | Freq: Once | ORAL | Status: DC | PRN

## 2019-08-26 MED ORDER — SODIUM CHLORIDE 0.9% FLUSH: 3.0000 mL | INTRAVENOUS | Status: DC | PRN

## 2019-08-26 MED ORDER — IBUPROFEN 800 MG PO TABS
800.0000 mg | ORAL_TABLET | Freq: Three times a day (TID) | ORAL | Status: AC
  Administered 2019-08-26 – 2019-08-29 (×9): 800 mg via ORAL
  Filled 2019-08-26 (×9): qty 1

## 2019-08-26 MED ORDER — NALOXONE HCL 0.4 MG/ML IJ SOLN: 0.4000 mg | INTRAMUSCULAR | Status: DC | PRN

## 2019-08-26 MED ORDER — PHENYLEPHRINE HCL-NACL 20-0.9 MG/250ML-% IV SOLN
INTRAVENOUS | Status: AC
  Filled 2019-08-26: qty 250

## 2019-08-26 MED ORDER — ZOLPIDEM TARTRATE 5 MG PO TABS: 5.0000 mg | ORAL_TABLET | Freq: Every evening | ORAL | Status: DC | PRN

## 2019-08-26 MED ORDER — FENTANYL CITRATE (PF) 100 MCG/2ML IJ SOLN
INTRAMUSCULAR | Status: DC | PRN
  Administered 2019-08-26: 15 ug via INTRATHECAL

## 2019-08-26 MED ORDER — DEXAMETHASONE SODIUM PHOSPHATE 4 MG/ML IJ SOLN
INTRAMUSCULAR | Status: DC | PRN
  Administered 2019-08-26: 5 mg via INTRAVENOUS

## 2019-08-26 MED ORDER — MEPERIDINE HCL 25 MG/ML IJ SOLN: 6.2500 mg | INTRAMUSCULAR | Status: DC | PRN

## 2019-08-26 MED ORDER — GENTAMICIN SULFATE 40 MG/ML IJ SOLN
5.0000 mg/kg | INTRAVENOUS | Status: AC
  Administered 2019-08-26: 300 mg via INTRAVENOUS
  Filled 2019-08-26: qty 7.5

## 2019-08-26 MED ORDER — SIMETHICONE 80 MG PO CHEW
80.0000 mg | CHEWABLE_TABLET | Freq: Three times a day (TID) | ORAL | Status: DC
  Administered 2019-08-26 – 2019-08-29 (×9): 80 mg via ORAL
  Filled 2019-08-26 (×9): qty 1

## 2019-08-26 MED ORDER — PHENYLEPHRINE HCL-NACL 20-0.9 MG/250ML-% IV SOLN
INTRAVENOUS | Status: DC | PRN
  Administered 2019-08-26: 60 ug/min via INTRAVENOUS

## 2019-08-26 MED ORDER — KETOROLAC TROMETHAMINE 30 MG/ML IJ SOLN
30.0000 mg | Freq: Once | INTRAMUSCULAR | Status: AC | PRN
  Administered 2019-08-26: 11:00:00 30 mg via INTRAVENOUS

## 2019-08-26 MED ORDER — DIPHENHYDRAMINE HCL 25 MG PO CAPS: 25.0000 mg | ORAL_CAPSULE | ORAL | Status: DC | PRN

## 2019-08-26 MED ORDER — WITCH HAZEL-GLYCERIN EX PADS: 1.0000 "application " | MEDICATED_PAD | CUTANEOUS | Status: DC | PRN

## 2019-08-26 MED ORDER — OXYTOCIN 40 UNITS IN NORMAL SALINE INFUSION - SIMPLE MED
INTRAVENOUS | Status: DC | PRN
  Administered 2019-08-26: 40 [IU] via INTRAVENOUS

## 2019-08-26 MED ORDER — SENNOSIDES-DOCUSATE SODIUM 8.6-50 MG PO TABS
2.0000 | ORAL_TABLET | ORAL | Status: DC
  Administered 2019-08-26 – 2019-08-29 (×3): 2 via ORAL
  Filled 2019-08-26 (×3): qty 2

## 2019-08-26 MED ORDER — DIBUCAINE (PERIANAL) 1 % EX OINT: 1.0000 "application " | TOPICAL_OINTMENT | CUTANEOUS | Status: DC | PRN

## 2019-08-26 MED ORDER — MORPHINE SULFATE (PF) 0.5 MG/ML IJ SOLN
INTRAMUSCULAR | Status: DC | PRN
  Administered 2019-08-26: 150 ug via INTRATHECAL

## 2019-08-26 MED ORDER — CLINDAMYCIN PHOSPHATE 900 MG/50ML IV SOLN
INTRAVENOUS | Status: AC
  Filled 2019-08-26: qty 50

## 2019-08-26 MED ORDER — ONDANSETRON HCL 4 MG/2ML IJ SOLN
4.0000 mg | Freq: Three times a day (TID) | INTRAMUSCULAR | Status: DC | PRN
  Administered 2019-08-26: 10:00:00 4 mg via INTRAVENOUS

## 2019-08-26 MED ORDER — DIPHENHYDRAMINE HCL 50 MG/ML IJ SOLN
INTRAMUSCULAR | Status: AC
  Filled 2019-08-26: qty 1

## 2019-08-26 MED ORDER — CLINDAMYCIN PHOSPHATE 900 MG/50ML IV SOLN
900.0000 mg | INTRAVENOUS | Status: AC
  Administered 2019-08-26: 10:00:00 900 mg via INTRAVENOUS

## 2019-08-26 MED ORDER — OXYCODONE HCL 5 MG/5ML PO SOLN: 5.0000 mg | Freq: Once | ORAL | Status: DC | PRN

## 2019-08-26 SURGICAL SUPPLY — 39 items
BENZOIN TINCTURE PRP APPL 2/3 (GAUZE/BANDAGES/DRESSINGS) ×3 IMPLANT
CHLORAPREP W/TINT 26ML (MISCELLANEOUS) ×3 IMPLANT
CLAMP CORD UMBIL (MISCELLANEOUS) IMPLANT
CLOSURE STERI STRIP 1/2 X4 (GAUZE/BANDAGES/DRESSINGS) ×3 IMPLANT
CLOSURE STERI-STRIP 1/4X4 (GAUZE/BANDAGES/DRESSINGS) ×3 IMPLANT
CLOSURE WOUND 1/2 X4 (GAUZE/BANDAGES/DRESSINGS)
CLOTH BEACON ORANGE TIMEOUT ST (SAFETY) ×3 IMPLANT
DRSG OPSITE POSTOP 4X10 (GAUZE/BANDAGES/DRESSINGS) ×3 IMPLANT
ELECT REM PT RETURN 9FT ADLT (ELECTROSURGICAL) ×3
ELECTRODE REM PT RTRN 9FT ADLT (ELECTROSURGICAL) ×1 IMPLANT
EXTRACTOR VACUUM KIWI (MISCELLANEOUS) IMPLANT
GLOVE BIO SURGEON STRL SZ 6.5 (GLOVE) ×2 IMPLANT
GLOVE BIO SURGEONS STRL SZ 6.5 (GLOVE) ×1
GLOVE BIOGEL PI IND STRL 7.0 (GLOVE) ×1 IMPLANT
GLOVE BIOGEL PI INDICATOR 7.0 (GLOVE) ×2
GOWN STRL REUS W/TWL LRG LVL3 (GOWN DISPOSABLE) ×6 IMPLANT
KIT ABG SYR 3ML LUER SLIP (SYRINGE) IMPLANT
NEEDLE HYPO 25X5/8 SAFETYGLIDE (NEEDLE) IMPLANT
NS IRRIG 1000ML POUR BTL (IV SOLUTION) ×3 IMPLANT
PACK C SECTION WH (CUSTOM PROCEDURE TRAY) ×3 IMPLANT
PAD OB MATERNITY 4.3X12.25 (PERSONAL CARE ITEMS) ×3 IMPLANT
PENCIL SMOKE EVAC W/HOLSTER (ELECTROSURGICAL) ×3 IMPLANT
RTRCTR C-SECT PINK 25CM LRG (MISCELLANEOUS) ×3 IMPLANT
STRIP CLOSURE SKIN 1/2X4 (GAUZE/BANDAGES/DRESSINGS) IMPLANT
SUT CHROMIC 1 CTX 36 (SUTURE) ×6 IMPLANT
SUT PLAIN 0 NONE (SUTURE) IMPLANT
SUT PLAIN 2 0 XLH (SUTURE) ×3 IMPLANT
SUT VIC AB 0 CT1 27 (SUTURE) ×4
SUT VIC AB 0 CT1 27XBRD ANBCTR (SUTURE) ×2 IMPLANT
SUT VIC AB 2-0 CT1 27 (SUTURE) ×2
SUT VIC AB 2-0 CT1 TAPERPNT 27 (SUTURE) ×1 IMPLANT
SUT VIC AB 3-0 CT1 27 (SUTURE)
SUT VIC AB 3-0 CT1 TAPERPNT 27 (SUTURE) IMPLANT
SUT VIC AB 3-0 SH 27 (SUTURE) ×2
SUT VIC AB 3-0 SH 27X BRD (SUTURE) ×1 IMPLANT
SUT VIC AB 4-0 KS 27 (SUTURE) ×3 IMPLANT
TOWEL OR 17X24 6PK STRL BLUE (TOWEL DISPOSABLE) ×3 IMPLANT
TRAY FOLEY W/BAG SLVR 14FR LF (SET/KITS/TRAYS/PACK) ×3 IMPLANT
WATER STERILE IRR 1000ML POUR (IV SOLUTION) ×3 IMPLANT

## 2019-08-26 NOTE — Lactation Note (Signed)
This note was copied from a baby's chart. Lactation Consultation Note Baby 39 hrs old.  Mom's youngest child is 39 yrs old. Mom plans to BF this baby as long as she can. Over 1 yr hopefully. Mom stated baby BF well earlier baby appeared satisfied afterwards having lg. Stool and voided. Newborn behavior, feeding habits, STS, I&O, breast massage, assessing for transfer, milk storage, positioning, support, supply and demand discussed. Mom encouraged to feed baby 8-12 times/24 hours and with feeding cues. If baby hasn't cued to feed in 3 hrs then wake baby for stimulation and try to feed baby.  Mom has "V" shaped breast. Heavy. Mom has semi flat/short shaft nipples. Mom stated baby has a good feeding. Mom has easily expressed colostrum. Encouraged mom to call for feeding assistance on next feeding. FOB at bedside assisting in caring for baby. Very attentive.  Lactation brochure given.  Patient Name: [name] Date: 08/26/2019 Reason for consult: Initial assessment;Term   Maternal Data Has patient been taught Hand Expression?: Yes Does the patient have breastfeeding experience prior to this delivery?: Yes  Feeding Feeding Type: Breast Fed  LATCH Score       Type of Nipple: Everted at rest and after stimulation(short shaft)  Comfort (Breast/Nipple): Filling, red/small blisters or bruises, mild/mod discomfort(breast filling heavy)        Interventions Interventions: Breast feeding basics reviewed;Position options;Skin to skin;Breast massage;Hand express;Breast compression  Lactation Tools Discussed/Used WIC Program: No   Consult Status Consult Status: Follow-up Date: 08/27/19 Follow-up type: In-patient    Charyl Dancer 08/26/2019, 9:52 PM

## 2019-08-26 NOTE — Op Note (Signed)
Operative Note    Preoperative Diagnosis Term Pregnancy at 39 weeks Prior c-section x 2, declines TOL  Postoperative Diagnosis same  Procedure Repeat low transverse c-section with 2 layer closure of uterus  Surgeon Huel Cote, MD Genice Rouge, RNFA  Anesthesia Spinal  Fluids: EBL UOP IVF   Findings Viable female infant in vertex presentation.  Apgars 9,9 weight pending   Uterus, tubes and ovaries WNL.    Specimen Placenta to L&D  Procedure Note Patient was taken to the operating room where spinal anesthesia was obtained and found to be adequate by Allis clamp test. She was prepped and draped in the normal sterile fashion in the dorsal supine position with a leftward tilt. An appropriate time out was performed. A Pfannenstiel skin incision was then made through a pre-existing scar with the scalpel and carried through to the underlying layer of fascia by sharp dissection and Bovie cautery. The fascia was nicked in the midline and the incision was extended laterally with Mayo scissors. The inferior aspect of the incision was grasped Coker clamps and dissected off the underlying rectus muscles. In a similar fashion the superior aspect was dissected off the rectus muscles. Rectus muscles were separated in the midline and the peritoneal cavity entered bluntly. The peritoneal incision was then extended both superiorly and inferiorly with careful attention to avoid both bowel and bladder. The Alexis self-retaining wound retractor was then placed within the incision and the lower uterine segment exposed. The bladder flap was developed with Metzenbaum scissors and pushed away from the lower uterine segment. The lower uterine segment was then incised in a transverse fashion and the cavity itself entered bluntly. The incision was extended bluntly. The infant's head was then lifted and delivered from the incision without difficulty. The remainder of the infant delivered  and the nose and mouth bulb suctioned with the cord clamped and cut as well. The infant was handed off to the waiting pediatricians. The placenta was then spontaneously expressed from the uterus and the uterus cleared of all clots and debris with moist lap sponge. The uterine incision was then repaired in 2 layers the first layer was a running locked layer of 1-0 chromic and the second an imbricating layer of the same suture. The tubes and ovaries were inspected and the gutters cleared of all clots and debris. The uterine incision was inspected and found to be hemostatic. All instruments and sponges as well as the Alexis retractor were then removed from the abdomen. The rectus muscles and peritoneum were then reapproximated with a running suture of 2-0 Vicryl. The fascia was then closed with 0 Vicryl in a running fashion. Subcutaneous tissue was reapproximated with 3-0 plain in a running fashion. The skin was closed with a subcuticular stitch of 4-0 Vicryl on a Keith needle and then reinforced with benzoin and Steri-Strips. At the conclusion of the procedure all instruments and sponge counts were correct. Patient was taken to the recovery room in good condition with her baby accompanying her skin to skin.

## 2019-08-26 NOTE — Transfer of Care (Signed)
Immediate Anesthesia Transfer of Care Note  Patient: [name]  Procedure(s) Performed: CESAREAN SECTION (N/A )  Patient Location: PACU  Anesthesia Type:Spinal  Level of Consciousness: awake, alert  and oriented  Airway & Oxygen Therapy: Patient Spontanous Breathing  Post-op Assessment: Report given to RN and Post -op Vital signs reviewed and stable  Post vital signs: Reviewed and stable  Last Vitals:  Vitals Value Taken Time  BP 114/99 08/26/19 1108  Temp    Pulse 68 08/26/19 1112  Resp 13 08/26/19 1112  SpO2 100 % 08/26/19 1112  Vitals shown include unvalidated device data.  Last Pain:  Vitals:   08/26/19 0822  TempSrc: Oral         Complications: No apparent anesthesia complications

## 2019-08-26 NOTE — Anesthesia Procedure Notes (Signed)
Spinal  Patient location during procedure: OB Start time: 08/26/2019 9:59 AM End time: 08/26/2019 10:04 AM Staffing Performed: anesthesiologist  Anesthesiologist: Lowella Curb, MD Preanesthetic Checklist Completed: patient identified, IV checked, risks and benefits discussed, surgical consent, monitors and equipment checked, pre-op evaluation and timeout performed Spinal Block Patient position: sitting Prep: DuraPrep and site prepped and draped Patient monitoring: heart rate, cardiac monitor, continuous pulse ox and blood pressure Approach: midline Location: L3-4 Injection technique: single-shot Needle Needle type: Pencan  Needle gauge: 24 G Needle length: 10 cm Assessment Sensory level: T4 Additional Notes Attempt by SRNA.  SAB placed by anesthesiologist

## 2019-08-26 NOTE — Anesthesia Preprocedure Evaluation (Signed)
Anesthesia Evaluation  Patient identified by MRN, date of birth, ID band Patient awake    Reviewed: Allergy & Precautions, NPO status , Patient's Chart, lab work & pertinent test results  Airway Mallampati: II  TM Distance: >3 FB Neck ROM: Full    Dental no notable dental hx.    Pulmonary neg pulmonary ROS,    Pulmonary exam normal breath sounds clear to auscultation       Cardiovascular negative cardio ROS Normal cardiovascular exam Rhythm:Regular Rate:Normal     Neuro/Psych  Headaches, negative psych ROS   GI/Hepatic negative GI ROS, Neg liver ROS,   Endo/Other  negative endocrine ROS  Renal/GU negative Renal ROS  negative genitourinary   Musculoskeletal negative musculoskeletal ROS (+)   Abdominal   Peds negative pediatric ROS (+)  Hematology negative hematology ROS (+)   Anesthesia Other Findings   Reproductive/Obstetrics (+) Pregnancy                             Anesthesia Physical Anesthesia Plan  ASA: II  Anesthesia Plan: Spinal   Post-op Pain Management:    Induction:   PONV Risk Score and Plan: 2 and Ondansetron, Midazolam and Treatment may vary due to age or medical condition  Airway Management Planned: Natural Airway  Additional Equipment:   Intra-op Plan:   Post-operative Plan:   Informed Consent: I have reviewed the patients History and Physical, chart, labs and discussed the procedure including the risks, benefits and alternatives for the proposed anesthesia with the patient or authorized representative who has indicated his/her understanding and acceptance.     Dental advisory given  Plan Discussed with: CRNA  Anesthesia Plan Comments:         Anesthesia Quick Evaluation

## 2019-08-26 NOTE — Anesthesia Postprocedure Evaluation (Signed)
Anesthesia Post Note  Patient: [name]  Procedure(s) Performed: CESAREAN SECTION (N/A )     Patient location during evaluation: PACU Anesthesia Type: Spinal Level of consciousness: awake and alert Pain management: pain level controlled Vital Signs Assessment: post-procedure vital signs reviewed and stable Respiratory status: spontaneous breathing, nonlabored ventilation and respiratory function stable Cardiovascular status: blood pressure returned to baseline and stable Postop Assessment: no apparent nausea or vomiting Anesthetic complications: no    Last Vitals:  Vitals:   08/26/19 1215 08/26/19 1230  BP: 93/62   Pulse: 66 64  Resp: 18 12  Temp: 36.4 C   SpO2: 100% 99%    Last Pain:  Vitals:   08/26/19 1230  TempSrc:   PainSc: 0-No pain   Pain Goal:    LLE Motor Response: No movement due to regional block (08/26/19 1230)   RLE Motor Response: No movement due to regional block (08/26/19 1230)       Epidural/Spinal Function Cutaneous sensation: Able to Discern Pressure (08/26/19 1230), Patient able to flex knees: No (08/26/19 1230), Patient able to lift hips off bed: No (08/26/19 1230), Back pain beyond tenderness at insertion site: No (08/26/19 1230), Progressively worsening motor and/or sensory loss: No (08/26/19 1230), Bowel and/or bladder incontinence post epidural: No (08/26/19 1230)  Lowella Curb

## 2019-08-26 NOTE — Progress Notes (Signed)
Patient ID: [name], female   DOB: 1980/12/13, 39 y.o.   MRN: [id number] Per pt no changes in dictated H&P.  Brief exam WNL and ready to proceed.

## 2019-08-27 LAB — BIRTH TISSUE RECOVERY COLLECTION (PLACENTA DONATION)

## 2019-08-27 LAB — CBC
HCT: 30.4 % — ABNORMAL LOW (ref 36.0–46.0)
Hemoglobin: 9.8 g/dL — ABNORMAL LOW (ref 12.0–15.0)
MCH: 30.8 pg (ref 26.0–34.0)
MCHC: 32.2 g/dL (ref 30.0–36.0)
MCV: 95.6 fL (ref 80.0–100.0)
Platelets: 189 10*3/uL (ref 150–400)
RBC: 3.18 MIL/uL — ABNORMAL LOW (ref 3.87–5.11)
RDW: 13.4 % (ref 11.5–15.5)
WBC: 13.7 10*3/uL — ABNORMAL HIGH (ref 4.0–10.5)
nRBC: 0 % (ref 0.0–0.2)

## 2019-08-27 NOTE — Progress Notes (Signed)
Subjective: Postpartum Day #1: Cesarean Delivery Patient reports incisional pain, tolerating PO and no problems voiding.    Objective: Vital signs in last 24 hours: Temp:  [97.5 F (36.4 C)-99.1 F (37.3 C)] 98.2 F (36.8 C) (04/23 0330) Pulse Rate:  [60-72] 65 (04/22 1245) Resp:  [12-18] 12 (04/22 1245) BP: (88-114)/(57-99) (P) 92/83 (04/23 0330) SpO2:  [97 %-100 %] 98 % (04/23 0330) Weight:  [80.1 kg] 80.1 kg (04/22 0102)  Physical Exam:  General: alert Lochia: appropriate Uterine Fundus: firm Incision: dressing C/D/I   Recent Labs    08/24/19 0913  HGB 12.7  HCT 38.3    Assessment/Plan: Status post Cesarean section. Doing well postoperatively.  Continue current care, ambulate.  Leighton Roach Aydan Phoenix 08/27/2019, 8:06 AM

## 2019-08-27 NOTE — Lactation Note (Signed)
This note was copied from a baby's chart. Lactation Consultation Note  Patient Name: [name] [id number] Date: 08/27/2019 Reason for consult: Follow-up assessment;Term  P3 mother whose infant is now 42 hours old.  This is a term baby at 39+0 weeks.  Mother breast fed her first child for four months and her second child for eleven months.  She hopes to breast feed this baby for at least a year.  Mother had a few questions which I answered to her satisfaction.  Baby was STS on mother's chest and she was waiting for baby to awaken to attempt feeding.  Offered to assist with latching and mother agreeable.  Mother verbalized that baby does not like the football hold.  I suggested the cross cradle hold and mother willing to try.  Mother's breasts are large, soft and non tender and nipples are everted and intact.  Mother wanted to sit in the chair and I helped her position comfortably prior to latching.  Reviewed the cross cradle position and assisted to latch to the left breast.  Immediately after latching, mother moved into the cradle posiiton and baby did not sustain her latch.  Assisted a second time to latch and explained the advantages of this position as opposed to the cradle hold.  Mother naturally desired to fall into the cradle hold again, however, she remained in the cross cradle hold with assistance.  Baby began sucking and fed for approximately 15 minutes.  She did require frequent stimulation to sustain a latch.  Baby continued to be sleepy but mother was able to learn a different hold and was appreciative of the help provided.  Informed her that a lactation consultant would be available during the night shift for further assistance as needed.  Mother has a DEBP for home use.   Maternal Data Formula Feeding for Exclusion: No Has patient been taught Hand Expression?: Yes Does the patient have breastfeeding experience prior to this delivery?: Yes  Feeding Feeding Type: Breast  Fed  LATCH Score Latch: Repeated attempts needed to sustain latch, nipple held in mouth throughout feeding, stimulation needed to elicit sucking reflex.  Audible Swallowing: None  Type of Nipple: Everted at rest and after stimulation  Comfort (Breast/Nipple): Soft / non-tender  Hold (Positioning): Assistance needed to correctly position infant at breast and maintain latch.  LATCH Score: 6  Interventions Interventions: Breast feeding basics reviewed;Assisted with latch;Skin to skin;Breast massage;Hand express;Breast compression;Adjust position;Support pillows  Lactation Tools Discussed/Used WIC Program: No   Consult Status Consult Status: Follow-up Date: 08/28/19 Follow-up type: In-patient    Dora Sims 08/27/2019, 6:31 PM

## 2019-08-28 MED ORDER — OXYCODONE HCL 5 MG PO TABS: 5.0000 mg | ORAL_TABLET | ORAL | 0 refills | Status: DC | PRN

## 2019-08-28 MED ORDER — IBUPROFEN 800 MG PO TABS: 800.0000 mg | ORAL_TABLET | Freq: Three times a day (TID) | ORAL | 0 refills | Status: AC
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
## 2019-08-28 NOTE — Discharge Instructions (Signed)
As per discharge pamphlet °

## 2019-08-28 NOTE — Discharge Summary (Signed)
OB Discharge Summary     Patient Name: [name] DOB: 1980/10/19 MRN: [id number]  Date of admission: 08/26/2019 Delivering MD: Paula Compton   Date of discharge: 08/29/2019  Admitting diagnosis: Status post repeat low transverse cesarean section [Z98.891] Intrauterine pregnancy: [redacted]w[redacted]d     Secondary diagnosis:  Active Problems:   Status post repeat low transverse cesarean section      Discharge diagnosis: Term [facility] course:  Sceduled C/S   39 y.o. yo G4P2101 at [redacted]w[redacted]d was admitted to the hospital 08/26/2019 for scheduled cesarean section with the following indication:Elective Repeat.  Membrane Rupture Time/Date: 10:27 AM ,08/26/2019   Patient delivered a Viable infant.08/26/2019  Details of operation can be found in separate operative note.  Pateint had an uncomplicated postpartum course.  She is ambulating, tolerating a regular diet, passing flatus, and urinating well. Patient is discharged home in stable condition on  08/29/19         Physical exam  Vitals:   08/28/19 0500 08/28/19 1419 08/28/19 2236 08/29/19 0530  BP: 116/61 93/65 108/71 114/75  Pulse: 71 71 72 60  Resp: 18 18 18 14   Temp: 98.2 F (36.8 C) 97.8 F (36.6 C) 98.1 F (36.7 C) 98 F (36.7 C)  TempSrc: Oral Oral Oral Axillary  SpO2:  100%  100%  Weight:      Height:       General: alert Lochia: appropriate Uterine Fundus: firm Incision: Healing well with no significant drainage  Labs: Lab Results  Component Value Date   WBC 13.7 (H) 08/27/2019   HGB 9.8 (L) 08/27/2019   HCT 30.4 (L) 08/27/2019   MCV 95.6 08/27/2019   PLT 189 08/27/2019   CMP Latest Ref Rng & Units 03/04/2018  Glucose 65 - 99 mg/dL 86  BUN 7 - 25 mg/dL 13  Creatinine 0.50 - 1.10 mg/dL 0.78  Sodium 135 - 146 mmol/L 137  Potassium 3.5 - 5.3 mmol/L 4.2  Chloride 98 - 110 mmol/L 104  CO2 20 - 32 mmol/L 25  Calcium 8.6 - 10.2 mg/dL 9.6  Total Protein 6.1 - 8.1 g/dL 7.2   Total Bilirubin 0.2 - 1.2 mg/dL 0.6  AST 10 - 30 U/L 12  ALT 6 - 29 U/L 13    Discharge instruction: per After Visit Summary and "Baby and Me Booklet".  After visit meds:  Allergies as of 08/29/2019      Reactions   Codeine Nausea And Vomiting   GI upset   Tamiflu [oseltamivir Phosphate] Hives   Amoxicillin       Medication List    STOP taking these medications   azithromycin 250 MG tablet Commonly known as: Zithromax Z-Pak   cetirizine 10 MG tablet Commonly known as: ZYRTEC     TAKE these medications   amoxicillin 500 MG capsule Commonly known as: AMOXIL Take 1 capsule (500 mg total) by mouth 3 (three) times daily.   ibuprofen 800 MG tablet Commonly known as: ADVIL Take 1 tablet (800 mg total) by mouth every 8 (eight) hours.   linaclotide 145 MCG Caps capsule Commonly known as: LINZESS Take 1 capsule (145 mcg total) by mouth daily before breakfast.   loratadine 10 MG tablet Commonly known as: CLARITIN Take 10 mg by mouth daily.   meclizine 25 MG tablet  Commonly known as: ANTIVERT Take 1 tablet (25 mg total) by mouth 3 (three) times daily as needed for dizziness or nausea.   oxyCODONE 5 MG immediate release tablet Commonly known as: Oxy IR/ROXICODONE Take 1 tablet (5 mg total) by mouth every 4 (four) hours as needed for severe pain.   triamcinolone cream 0.1 % Commonly known as: KENALOG Apply 1 application topically 2 (two) times daily.   Vitamin B-12 2500 MCG Subl Place under the tongue.   Vitamin D (Ergocalciferol) 1.25 MG (50000 UNIT) Caps capsule Commonly known as: DRISDOL Take 1 capsule (50,000 Units total) by mouth every 7 (seven) days. Take for 8 total doses(weeks)   VITAMIN D PO Take 2,000 Units by mouth.       Diet: routine diet  Activity: Advance as tolerated. Pelvic rest for 6 weeks.   Outpatient follow up:2 weeks  Newborn Data: Live born female  Birth Weight: 7 lb 13.4 oz (3555 g) APGAR: 9, 9  Newborn Delivery   Birth  date/time: 08/26/2019 10:27:00 Delivery type: C-Section, Low Transverse Trial of labor: No C-section categorization: Repeat      Baby Feeding: Breast Disposition:home with mother   08/29/2019 Zenaida Niece, MD

## 2019-08-28 NOTE — Lactation Note (Signed)
This note was copied from a baby's chart. Lactation Consultation Note  Patient Name: [name] OVFIE'P Date: 08/28/2019 Reason for consult: Follow-up assessment;Term  P3 mother whose infant is now 51 hours old.  This is a term baby at 39+0 weeks.  Baby has a 10% weight loss this morning.  Mother breast fed her first child (now 39 years old) for 4 months and her second child (now 50 years old) for 11 months.  Baby was asleep in father's arms when I arrived, however, she did wake up crying during my visit with mother.  She was having some gas and the parents stated that she has been passing gas.  Offered to assist with latching and mother agreeable.  Mother's breasts are large, filling and non tender with the exception of a small lump in the outer aspect of her right breast.  RN provided a warm compress and with breast massage mother felt like the lump had resolved.  Upon my assessment I noticed a full breast, but no engorgement.  Discussed how to care for her breasts if this should happen again.  Attempted to latch baby in the cross cradle hold on the left breast without success.  She would latch, take a couple of sucks, and begin crying frantically.  Calmed her with a few drops of colostrum on my gloved finger and attempted to latch again with the same results.  Father swaddled baby and held her.  She fell asleep.  Mother informed me that she has been waking and feeding baby every two hours throughout the night.  Baby appeared gassy and tired.  Suggested father hold her and offered to initiate the DEBP to help with milk supply and obtain EBM for supplementation.  Mother very interested in pumping.  She feels her milk will "come in" tomorrow since she experienced milk coming to volume in this time frame with her two previous children.    Pump parts, assembly, disassembly and cleaning reviewed.  #24 flange changed to a #27 flange for greater fit and comfort.  Mother knows how to monitor flange size  and may need to increase to a #30 in the near future with continued pumping.  Observed mother pumping while discussing breast feeding basics for 15 minutes.  At the end of the session mother was able to obtain 1 ml of EBM which will be fed to baby when she awakens.  Mother rubbed colostrum into nipples/areolas and I provided comfort gels with instructions for use.    Mother has been discharged, however, the pediatrician has not yet made rounds.  We will wait to see if baby will be discharged today.  Mother and I devised a feeding plan/pumping plan for the rest of today and tomorrow.  Mother does not wish to supplement with anything other than her breast milk currently.  She remembers going through this same process with her other children and, once they got home, the children started gaining weight as soon as her milk came to volume.  Emotional support provided.  Father present.  Mother informed me that parents are separated and we discussed this in relation to providing breast milk for the baby and feeding options.  Parents will work through their options to best provide for their child.    Manual pump provided and hand expression encouraged.  Mother has a DEBP for home use.  RN updated.   Maternal Data Formula Feeding for Exclusion: No Has patient been taught Hand Expression?: Yes Does the patient have  breastfeeding experience prior to this delivery?: Yes  Feeding Feeding Type: Breast Fed  LATCH Score Latch: Too sleepy or reluctant, no latch achieved, no sucking elicited.  Audible Swallowing: None  Type of Nipple: Everted at rest and after stimulation  Comfort (Breast/Nipple): Soft / non-tender  Hold (Positioning): Assistance needed to correctly position infant at breast and maintain latch.  LATCH Score: 5  Interventions Interventions: Breast feeding basics reviewed;Assisted with latch;Skin to skin;Breast massage;Hand express;Breast compression;Adjust position;DEBP;Hand pump;Comfort  gels;Coconut oil;Position options;Support pillows  Lactation Tools Discussed/Used Tools: Pump;Flanges;Feeding cup Flange Size: 27 Breast pump type: Double-Electric Breast Pump;Manual WIC Program: No Pump Review: Setup, frequency, and cleaning;Milk Storage Initiated by:: Dianelly Ferran Date initiated:: 08/28/19   Consult Status Consult Status: Follow-up Date: 08/29/19 Follow-up type: In-patient    [name] R [name] 08/28/2019, 11:02 AM
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
## 2019-08-28 NOTE — Progress Notes (Signed)
POD #2 LTCS Doing well, sore, wants to go home later today Afeb, VSS Abd- soft, fundus firm, incision intact Discharge home today

## 2019-08-28 NOTE — Plan of Care (Signed)
  Problem: Education: Goal: Knowledge of condition will improve Note: Pediatrician keeping infant overnight. Patient would like to stay a patient through the night as well since today would be an early discharge. Patient working on feedings and pain management. Called Dr. Jackelyn Knife, who gave verbal order to cancel discharge. Earl Gala, Linda Hedges [place]

## 2019-08-29 NOTE — Lactation Note (Signed)
This note was copied from a baby's chart. Lactation Consultation Note  Patient Name: [name] [id number] Date: 08/29/2019 Reason for consult: Follow-up assessment;Engorgement;Difficult latch;Infant weight loss Baby is 71 hours old/12% weight loss.  Mom became engorged last night and baby is unable to latch.  She has been treating with ice and massage.  She is pumping with both the manual pump and DEBP.  Discussed the importance of increasing supplement amount due to difficult latch and weight loss.  Mom is agreeable to using donor milk if needed.  Mom just pumped 12 mls with manual pump.  Breasts are moderately engorged.  Assisted with positioning baby in cross cradle hold.  Several attempts made to latch to breast but baby unable to grasp tissue.  A 24 mm nipple shield applied.  Shield filled with expressed milk.  After a few attempts baby latched and sucked well.  Nipple shield filled again when she lost interest in sucking.  After 15 minutes remainder of expressed milk was finger fed to baby.  Mom states she is exhausted and hopes to get a nap today.  Encouraged mom to eat her breakfast and then pump with DEBP.  Instructed to give expressed milk back to baby.  If volume is not sufficient for supplementation donor milk will be used.  Report given to RN.  Baby should be receiving at least 30 mls of expressed milk every 3 hours.  I will follow up in 2 hours to assist with latch.  Maternal Data    Feeding Feeding Type: Breast Fed  LATCH Score Latch: Grasps breast easily, tongue down, lips flanged, rhythmical sucking.(with nipple shield)  Audible Swallowing: A few with stimulation  Type of Nipple: Everted at rest and after stimulation  Comfort (Breast/Nipple): Filling, red/small blisters or bruises, mild/mod discomfort  Hold (Positioning): Assistance needed to correctly position infant at breast and maintain latch.  LATCH Score: 7  Interventions Interventions: Breast compression;Assisted  with latch;Adjust position;Hand pump;DEBP;Skin to skin;Support pillows;Breast massage;Hand express;Pre-pump if needed  Lactation Tools Discussed/Used Tools: Nipple Shields Nipple shield size: 24   Consult Status Consult Status: Follow-up Date: 08/30/19 Follow-up type: In-patient    Huston Foley 08/29/2019, 9:44 AM

## 2019-08-29 NOTE — Lactation Note (Signed)
This note was copied from a baby's chart. Lactation Consultation Note  Patient Name: [name] VZCHY'[id number] Date: 08/29/2019 Reason for consult: Difficult latch;Mother's request;Term;Engorgement;Infant weight loss P3, 62 hour term female infant -10% weight loss. Mom with C/S delivery and currently engorged, per mom, past 7 hours infant has not been latching well at breast. Mom pumped 5 mls of colostrum LC discussed infant needs be supplemented due high weight loss -10% and due not latching well at breast currently. Mom understood and choose donor breastmilk as supplement if she doesn't have enough EBM to supplement infant . LC worked with mom on alleviating engorged breast using ice, breast massage and compressions, LC help mom with hand expressing, massaging breast while using DEBP mom expressed 18 mls that was fed to infant using a foley cup. When the first 9 mls of EBM given due to infant's fussiness, mom attempted to latch infant at breast, infant only held breast in mouth but wouldn't suckle and additional 9 mls were given again by foley cup based on infant's age and hours of life. Mom was pleased that her milk is coming in and she did not have to use donor breast milk for this feeding. Mom is very tired and expressed to [facility] she needs rest she not slept in the past two days, LC assured mom she understood her concerns.  Mom will continue to use ice will lay back  In bed and ice for 10 -15 minutes with diaper close to skin to prevent ice touching skin. Mom was given pain relief medication to help with engorgement. Mom's plans to get 2 or 3 hours of sleep after icing and then latch infant at breast for next feeding. Mom understand if breast are really full to pump only to soften breast and then latch infant for feeding. Due to weight loss mom will use DEBP after latching infant at breast and will give infant any EBM that is express and supplement with EBM and donor breast milk infant range at Day  3 is 18-25 mls per feeding. Mom knows to call RN or LC if she has any further questions, concerns or need assistance with latching infant at breast.   Maternal Data    Feeding    LATCH Score Latch: Too sleepy or reluctant, no latch achieved, no sucking elicited.  Audible Swallowing: None  Type of Nipple: Everted at rest and after stimulation  Comfort (Breast/Nipple): Soft / non-tender  Hold (Positioning): Assistance needed to correctly position infant at breast and maintain latch.  LATCH Score: 5  Interventions Interventions: Assisted with latch;Skin to skin;Hand express;Expressed milk;DEBP  Lactation Tools Discussed/Used Tools: Pump Flange Size: 27   Consult Status Consult Status: Follow-up Date: 08/29/19 Follow-up type: In-patient    Danelle Earthly 08/29/2019, 1:14 AM

## 2019-08-29 NOTE — Progress Notes (Signed)
POD #3 LTCS Doing well, still sore, did not go home yesterday due to feeding issues with baby Afeb, VSS Abd- soft, fundus firm, incision intact Continue routine care, will try discharge again this PM

## 2019-08-29 NOTE — Lactation Note (Addendum)
This note was copied from a baby's chart. Lactation Consultation Note  Patient Name: [name] [id number] Date: 08/29/2019 Reason for consult: Follow-up assessment;Difficult latch;Engorgement;Infant weight loss Mom recently pumped 5 mls with manual pump.  Assisted with postioning baby in cross cradle on left side.  Baby unable to latch so 24 mm nipple shield applied and filled with expressed milk.  Baby latched well and sucked actively for 15 minutes.  Swallows observed.  Baby was then supplemented with 20 mls of expressed/donor milk.  Instructed mom to ice for 20 minutes and then pump with DEBP.  Instructed to supplement with 30 mls or more if desired every 2-3 hours.  Discussed transitioning to a bottle for supplement at home.  Mom is very tired.  Maternal Data    Feeding Feeding Type: Breast Fed  LATCH Score Latch: Grasps breast easily, tongue down, lips flanged, rhythmical sucking.(with nipple shield)  Audible Swallowing: A few with stimulation  Type of Nipple: Everted at rest and after stimulation  Comfort (Breast/Nipple): Filling, red/small blisters or bruises, mild/mod discomfort  Hold (Positioning): Assistance needed to correctly position infant at breast and maintain latch.  LATCH Score: 7  Interventions    Lactation Tools Discussed/Used     Consult Status Consult Status: Follow-up Date: 08/30/19    Huston Foley 08/29/2019, 1:15 PM

## 2020-01-20 ENCOUNTER — Emergency Department (INDEPENDENT_AMBULATORY_CARE_PROVIDER_SITE_OTHER)

## 2020-01-20 ENCOUNTER — Emergency Department (INDEPENDENT_AMBULATORY_CARE_PROVIDER_SITE_OTHER)
Admission: EM | Admit: 2020-01-20 | Discharge: 2020-01-20 | Disposition: A | Discharge status: Home / Self Care | Source: Home / Self Care

## 2020-01-20 ENCOUNTER — Other Ambulatory Visit: Payer: Self-pay

## 2020-01-20 DIAGNOSIS — H9203 Otalgia, bilateral: Secondary | ICD-10-CM

## 2020-01-20 DIAGNOSIS — J069 Acute upper respiratory infection, unspecified: Secondary | ICD-10-CM

## 2020-01-20 DIAGNOSIS — Z20828 Contact with and (suspected) exposure to other viral communicable diseases: Secondary | ICD-10-CM

## 2020-01-20 DIAGNOSIS — R509 Fever, unspecified: Secondary | ICD-10-CM | POA: Diagnosis not present

## 2020-01-20 DIAGNOSIS — R05 Cough: Secondary | ICD-10-CM | POA: Diagnosis not present

## 2020-01-20 DIAGNOSIS — H6983 Other specified disorders of Eustachian tube, bilateral: Secondary | ICD-10-CM

## 2020-01-20 MED ORDER — AMOXICILLIN 500 MG PO CAPS: 500.0000 mg | ORAL_CAPSULE | Freq: Three times a day (TID) | ORAL | 0 refills | Status: DC

## 2020-01-20 NOTE — ED Triage Notes (Signed)
Patient presents to Urgent Care with complaints of cough and generalized body aches since the cough is slightly productive, has had a fever. Patient reports her infant was diagnosed w/ RSV recently and she thinks that is what has made her sick.  Pt has not been vaccinated for covid, infant tested negative for covid. Pt is breastfeeding.

## 2020-01-20 NOTE — ED Provider Notes (Signed)
[facility]    CSN: [id number] Arrival date & time: 01/20/20  0820      History   Chief Complaint Chief Complaint  Patient presents with  . Cough    HPI [name] is a 39 y.o. female.   HPI  [name] is a 39 y.o. female presenting to UC with c/o mildly productive cough, congestion, generalized body aches and fever that has resolved. Associated Left ear pain. Pt concerned for possible ear infection, hx of same.  Her 49mo old tested positive for RSV recently and thinks that is what has caused her symptoms. Infant was negative for COVID. Mother not vaccinated for COVID. She is breastfeeding.   Past Medical History:  Diagnosis Date  . Headache   . History of kidney stones   . History of pyelonephritis   . Vitamin B12 deficiency   . Vitamin D deficiency     Patient Active Problem List   Diagnosis Date Noted  . Status post repeat low transverse cesarean section 08/26/2019  . Dyspareunia 03/04/2018  . Vaginal ulcer 03/04/2018  . Bilateral numbness and tingling of arms and legs 03/18/2016  . Migraine with aura and without status migrainosus, not intractable 03/18/2016  . Syncope 03/18/2016  . Vitamin B12 deficiency 03/04/2016  . Vitamin D insufficiency 03/04/2016  . Kidney stone 07/14/2015    Past Surgical History:  Procedure Laterality Date  . CESAREAN SECTION  2005, 2010  . CESAREAN SECTION N/A 08/26/2019   Procedure: CESAREAN SECTION;  Surgeon: Huel Cote, MD;  Location: [facility];  Service: Obstetrics;  Laterality: N/A;  Tracey RNFA  . WISDOM TOOTH EXTRACTION      OB History    Gravida  4   Para  3   Term  2   Preterm  1   AB      Living  1     SAB      TAB      Ectopic      Multiple  0   Live Births  1            Home Medications    Prior to Admission medications   Medication Sig Start Date End Date Taking? Authorizing Provider  amoxicillin (AMOXIL) 500 MG capsule Take 1 capsule (500 mg total) by mouth 3  (three) times daily. 01/20/20   Lurene Shadow, PA-C  Cyanocobalamin (VITAMIN B-12) 2500 MCG SUBL Place under the tongue.    [provider]  ibuprofen (ADVIL) 800 MG tablet Take 1 tablet (800 mg total) by mouth every 8 (eight) hours. 08/28/19   Meisinger, Tawanna Cooler, MD  linaclotide [employer]) 145 MCG CAPS capsule Take 1 capsule (145 mcg total) by mouth daily before breakfast. 03/04/18   Sunnie Nielsen, DO  loratadine (CLARITIN) 10 MG tablet Take 10 mg by mouth daily.    [provider]  meclizine (ANTIVERT) 25 MG tablet Take 1 tablet (25 mg total) by mouth 3 (three) times daily as needed for dizziness or nausea. 01/18/19   Lurene Shadow, PA-C  oxyCODONE (OXY IR/ROXICODONE) 5 MG immediate release tablet Take 1 tablet (5 mg total) by mouth every 4 (four) hours as needed for severe pain. 08/28/19   Meisinger, Todd, MD  triamcinolone cream (KENALOG) 0.1 % Apply 1 application topically 2 (two) times daily. Patient not taking: Reported on 09/07/2018 07/11/18   Lurene Shadow, PA-C  VITAMIN D PO Take 2,000 Units by mouth.    [provider]  Vitamin  D, Ergocalciferol, (DRISDOL) 50000 units CAPS capsule Take 1 capsule (50,000 Units total) by mouth every 7 (seven) days. Take for 8 total doses(weeks) 03/05/18   Sunnie Nielsen, DO    Family History Family History  Problem Relation Age of Onset  . Diabetes Mother   . Hypertension Mother   . Thyroid disease Mother   . Diabetes Father   . Hypertension Father   . Emphysema Father   . Heart attack Father   . Liver disease Father   . Parkinson's disease Father   . Breast cancer Maternal Grandmother   . Diabetes Maternal Grandmother     Social History Social History   Tobacco Use  . Smoking status: Never Smoker  . Smokeless tobacco: Never Used  Vaping Use  . Vaping Use: Never used  Substance Use Topics  . Alcohol use: No  . Drug use: No     Allergies   Codeine and Tamiflu [oseltamivir phosphate]   Review of  Systems Review of Systems  Constitutional: Negative for chills and fever.  HENT: Positive for congestion, ear pain and postnasal drip. Negative for sore throat, trouble swallowing and voice change.   Respiratory: Positive for cough. Negative for shortness of breath.   Cardiovascular: Negative for chest pain and palpitations.  Gastrointestinal: Negative for abdominal pain, diarrhea, nausea and vomiting.  Musculoskeletal: Positive for arthralgias, back pain and myalgias.  Skin: Negative for rash.  Neurological: Positive for headaches. Negative for dizziness and light-headedness.  All other systems reviewed and are negative.    Physical Exam Triage Vital Signs ED Triage Vitals  Enc Vitals Group     BP 01/20/20 0835 134/70     Pulse Rate 01/20/20 0835 81     Resp 01/20/20 0835 16     Temp 01/20/20 0835 98.2 F (36.8 C)     Temp Source 01/20/20 0835 Oral     SpO2 01/20/20 0835 99 %     Weight --      Height --      Head Circumference --      Peak Flow --      Pain Score 01/20/20 0833 7     Pain Loc --      Pain Edu? --      Excl. in GC? --    No data found.  Updated Vital Signs BP 134/70 (BP Location: Left Arm)   Pulse 81   Temp 98.2 F (36.8 C) (Oral)   Resp 16   SpO2 99%   Breastfeeding Yes   Visual Acuity Right Eye Distance:   Left Eye Distance:   Bilateral Distance:    Right Eye Near:   Left Eye Near:    Bilateral Near:     Physical Exam Vitals and nursing note reviewed.  Constitutional:      General: She is not in acute distress.    Appearance: Normal appearance. She is well-developed. She is not ill-appearing, toxic-appearing or diaphoretic.  HENT:     Head: Normocephalic and atraumatic.     Right Ear: Tympanic membrane and ear canal normal.     Left Ear: Tympanic membrane and ear canal normal.     Nose: Nose normal.     Right Sinus: No maxillary sinus tenderness or frontal sinus tenderness.     Left Sinus: No maxillary sinus tenderness or frontal  sinus tenderness.     Mouth/Throat:     Lips: Pink.     Mouth: Mucous membranes are moist.     Pharynx:  Oropharynx is clear. Uvula midline. No pharyngeal swelling, oropharyngeal exudate, posterior oropharyngeal erythema or uvula swelling.  Cardiovascular:     Rate and Rhythm: Normal rate and regular rhythm.  Pulmonary:     Effort: Pulmonary effort is normal. No respiratory distress.     Breath sounds: No stridor. Rhonchi (faint diffuse) present. No wheezing or rales.  Musculoskeletal:        General: Normal range of motion.     Cervical back: Normal range of motion and neck supple. No tenderness.  Lymphadenopathy:     Cervical: No cervical adenopathy.  Skin:    General: Skin is warm and dry.  Neurological:     Mental Status: She is alert and oriented to person, place, and time.  Psychiatric:        Behavior: Behavior normal.      UC Treatments / Results  Labs (all labs ordered are listed, but only abnormal results are displayed) Labs Reviewed  NOVEL CORONAVIRUS, NAA    EKG   Radiology DG Chest 2 View  Result Date: 01/20/2020 CLINICAL DATA:  Cough, congestion, chest soreness. Additional history provided: Patient reports productive cough, body aches and fever for 6 days. EXAM: CHEST - 2 VIEW COMPARISON:  No pertinent prior exams are available for comparison. FINDINGS: Heart size within normal limits. There is no appreciable airspace consolidation. No evidence of pleural effusion or pneumothorax. No acute bony abnormality identified. IMPRESSION: No evidence of active cardiopulmonary disease. Electronically Signed   By: Jackey Loge DO   On: 01/20/2020 09:25    Procedures Procedures (including critical care time)  Medications Ordered in UC Medications - No data to display  Initial Impression / Assessment and Plan / UC Course  I have reviewed the triage vital signs and the nursing notes.  Pertinent labs & imaging results that were available during my care of the patient  were reviewed by me and considered in my medical decision making (see chart for details).     Tympanometry: Right- High peak height, Left- negative tympanometric peak pressure. C/w eustachian tube dysfunction Hx and exam c/w viral illness Prescription to hold for amoxicillin to take if she develops fever, worsening pain or persisent symptoms COVID pending F/u with PCP as needed AVS given.  Final Clinical Impressions(s) / UC Diagnoses   Final diagnoses:  Viral URI with cough  RSV exposure  Acute ear pain, bilateral  Eustachian tube dysfunction, bilateral     Discharge Instructions      You may take 500mg  acetaminophen every 4-6 hours or in combination with ibuprofen 400-600mg  every 6-8 hours as needed for pain, inflammation, and fever.  Be sure to well hydrated with clear liquids and get at least 8 hours of sleep at night, preferably more while sick.   Please follow up with family medicine in 1 week if needed.     ED Prescriptions    Medication Sig Dispense Auth. Provider   amoxicillin (AMOXIL) 500 MG capsule Take 1 capsule (500 mg total) by mouth 3 (three) times daily. 21 capsule , Lurene Shadow     PDMP not reviewed this encounter.   [place], PA-C 01/20/20 2014

## 2020-01-20 NOTE — Discharge Instructions (Signed)
  You may take 500mg acetaminophen every 4-6 hours or in combination with ibuprofen 400-600mg every 6-8 hours as needed for pain, inflammation, and fever.  Be sure to well hydrated with clear liquids and get at least 8 hours of sleep at night, preferably more while sick.   Please follow up with family medicine in 1 week if needed.   

## 2020-01-22 LAB — SARS-COV-2, NAA 2 DAY TAT

## 2020-01-22 LAB — NOVEL CORONAVIRUS, NAA: SARS-CoV-2, NAA: NOT DETECTED

## 2020-05-06 DIAGNOSIS — U071 COVID-19: Secondary | ICD-10-CM

## 2020-05-06 HISTORY — DX: COVID-19: U07.1

## 2020-10-26 ENCOUNTER — Encounter: Payer: Self-pay | Admitting: Emergency Medicine

## 2020-10-26 ENCOUNTER — Other Ambulatory Visit: Payer: Self-pay

## 2020-10-26 ENCOUNTER — Emergency Department (INDEPENDENT_AMBULATORY_CARE_PROVIDER_SITE_OTHER)
Admission: EM | Admit: 2020-10-26 | Discharge: 2020-10-26 | Disposition: A | Discharge status: Home / Self Care | Source: Home / Self Care | Attending: Family Medicine | Admitting: Family Medicine

## 2020-10-26 DIAGNOSIS — J069 Acute upper respiratory infection, unspecified: Secondary | ICD-10-CM

## 2020-10-26 DIAGNOSIS — R059 Cough, unspecified: Secondary | ICD-10-CM

## 2020-10-26 DIAGNOSIS — H6691 Otitis media, unspecified, right ear: Secondary | ICD-10-CM

## 2020-10-26 MED ORDER — AMOXICILLIN 875 MG PO TABS: 875.0000 mg | ORAL_TABLET | Freq: Two times a day (BID) | ORAL | 0 refills | Status: AC

## 2020-10-26 NOTE — Discharge Instructions (Addendum)
Take plain guaifenesin (1200mg  extended release tabs such as Mucinex) twice daily, with plenty of water, for cough and congestion. Get adequate rest.   Also recommend using saline nasal spray several times daily and saline nasal irrigation (AYR is a common brand).    May take Delsym Cough Suppressant ("12 Hour Cough Relief") at bedtime for nighttime cough.  Try warm salt water gargles for sore throat.  May take Ibuprofen 200mg , 4 tabs every 8 hours with food for sore throat.  If your COVID-19 test is positive, isolate yourself for five days from today.  At the end of five days you may end isolation if your symptoms have cleared or improved, and you have not had a fever for 24 hours. At this time you should wear a mask for five more days when you are around others.   If symptoms become significantly worse during the night or over the weekend, proceed to the local emergency room.

## 2020-10-26 NOTE — ED Provider Notes (Signed)
[facility]    CSN: [id number] Arrival date & time: 10/26/20  1408      History   Chief Complaint Chief Complaint  Patient presents with   Sore Throat    HPI [name] is a 40 y.o. female.   One week ago patient developed a sore throat, followed by nasal congestion, cough, and fatigue.  Her ears have felt full. She had COVID19 in January but is unvaccinated.  The history is provided by the patient.   Past Medical History:  Diagnosis Date   Headache    History of kidney stones    History of pyelonephritis    Vitamin B12 deficiency    Vitamin D deficiency     Patient Active Problem List   Diagnosis Date Noted   Status post repeat low transverse cesarean section 08/26/2019   Dyspareunia 03/04/2018   Vaginal ulcer 03/04/2018   Bilateral numbness and tingling of arms and legs 03/18/2016   Migraine with aura and without status migrainosus, not intractable 03/18/2016   Syncope 03/18/2016   Vitamin B12 deficiency 03/04/2016   Vitamin D insufficiency 03/04/2016   Kidney stone 07/14/2015    Past Surgical History:  Procedure Laterality Date   CESAREAN SECTION  2005, 2010   CESAREAN SECTION N/A 08/26/2019   Procedure: CESAREAN SECTION;  Surgeon: Huel Cote, MD;  Location: [facility]S;  Service: Obstetrics;  Laterality: N/A;  Tracey RNFA   WISDOM TOOTH EXTRACTION      OB History     Gravida  4   Para  3   Term  2   Preterm  1   AB      Living  1      SAB      IAB      Ectopic      Multiple  0   Live Births  1            Home Medications    Prior to Admission medications   Medication Sig Start Date End Date Taking? Authorizing Provider  amoxicillin (AMOXIL) 875 MG tablet Take 1 tablet (875 mg total) by mouth 2 (two) times daily. 10/26/20  Yes Lattie Haw, MD  Cyanocobalamin (VITAMIN B-12) 2500 MCG SUBL Place under the tongue.    [provider]  ibuprofen (ADVIL) 800 MG tablet Take 1 tablet (800 mg total) by  mouth every 8 (eight) hours. 08/28/19   Meisinger, Tawanna Cooler, MD  linaclotide [facility]) 145 MCG CAPS capsule Take 1 capsule (145 mcg total) by mouth daily before breakfast. 03/04/18   Sunnie Nielsen, DO  loratadine (CLARITIN) 10 MG tablet Take 10 mg by mouth daily.    [provider]  VITAMIN D PO Take 2,000 Units by mouth.    [provider]  Vitamin D, Ergocalciferol, (DRISDOL) 50000 units CAPS capsule Take 1 capsule (50,000 Units total) by mouth every 7 (seven) days. Take for 8 total doses(weeks) 03/05/18   Sunnie Nielsen, DO    Family History Family History  Problem Relation Age of Onset   Diabetes Mother    Hypertension Mother    Thyroid disease Mother    Diabetes Father    Hypertension Father    Emphysema Father    Heart attack Father    Liver disease Father    Parkinson's disease Father    Breast cancer Maternal Grandmother    Diabetes Maternal Grandmother     Social History Social History   Tobacco Use   Smoking status: Never  Smokeless tobacco: Never  Vaping Use   Vaping Use: Never used  Substance Use Topics   Alcohol use: No   Drug use: No     Allergies   Codeine and Tamiflu [oseltamivir phosphate]   Review of Systems Review of Systems + sore throat + cough No pleuritic pain No wheezing + nasal congestion + post-nasal drainage No sinus pain/pressure No itchy/red eyes ? earache No hemoptysis No SOB ? fever, + chills No nausea No vomiting No abdominal pain No diarrhea No urinary symptoms No skin rash + fatigue No myalgias No headache   Physical Exam Triage Vital Signs ED Triage Vitals  Enc Vitals Group     BP 10/26/20 1532 102/72     Pulse Rate 10/26/20 1532 81     Resp 10/26/20 1532 18     Temp 10/26/20 1532 98.3 F (36.8 C)     Temp Source 10/26/20 1532 Oral     SpO2 10/26/20 1532 98 %     Weight 10/26/20 1533 165 lb (74.8 kg)     Height 10/26/20 1533 5\' 1"  (1.549 m)     Head Circumference --      Peak Flow  --      Pain Score 10/26/20 1532 2     Pain Loc --      Pain Edu? --      Excl. in GC? --    No data found.  Updated Vital Signs BP 102/72 (BP Location: Right Arm)   Pulse 81   Temp 98.3 F (36.8 C) (Oral)   Resp 18   Ht 5\' 1"  (1.549 m)   Wt 74.8 kg   LMP 10/22/2020 (Exact Date)   SpO2 98%   Breastfeeding Yes   BMI 31.18 kg/m   Visual Acuity Right Eye Distance:   Left Eye Distance:   Bilateral Distance:    Right Eye Near:   Left Eye Near:    Bilateral Near:     Physical Exam Nursing notes and Vital Signs reviewed. Appearance:  Patient appears stated age, and in no acute distress Eyes:  Pupils are equal, round, and reactive to light and accomodation.  Extraocular movement is intact.  Conjunctivae are not inflamed  Ears:  Canals normal.  Right tympanic membrane is scarred and erythematous.  Left tympanic membrane is normal. Nose:  Congested turbinates.  No sinus tenderness.    Pharynx:  Normal Neck:  Supple.  Mildly enlarged lateral nodes are present, tender to palpation on the left.   Lungs:  Clear to auscultation.  Breath sounds are equal.  Moving air well. Heart:  Regular rate and rhythm without murmurs, rubs, or gallops.  Abdomen:  Nontender without masses or hepatosplenomegaly.  Bowel sounds are present.  No CVA or flank tenderness.  Extremities:  No edema.  Skin:  No rash present.   UC Treatments / Results  Labs (all labs ordered are listed, but only abnormal results are displayed) Labs Reviewed  COVID-19, FLU A+B NAA - Abnormal; Notable for the following components:      Result Value   SARS-CoV-2, NAA Detected (*)    All other components within normal limits   Narrative:    Performed at:  [address], [place], [address]  [place] Lab Director: [place] MD, Phone:  [phone]    EKG   Radiology No results found.  Procedures Procedures (including critical care time)  Medications Ordered in UC Medications - No data  to display  Initial Impression /  Assessment and Plan / UC Course  I have reviewed the triage vital signs and the nursing notes.  Pertinent labs & imaging results that were available during my care of the patient were reviewed by me and considered in my medical decision making (see chart for details).    Begin amoxicillin. COVID19 PCR pending. Followup with Family Doctor if not improved in one week.   Final Clinical Impressions(s) / UC Diagnoses   Final diagnoses:  Cough  Viral URI with cough  Acute right otitis media     Discharge Instructions      Take plain guaifenesin (1200mg  extended release tabs such as Mucinex) twice daily, with plenty of water, for cough and congestion. Get adequate rest.   Also recommend using saline nasal spray several times daily and saline nasal irrigation (AYR is a common brand).    May take Delsym Cough Suppressant ("12 Hour Cough Relief") at bedtime for nighttime cough.  Try warm salt water gargles for sore throat.  May take Ibuprofen 200mg , 4 tabs every 8 hours with food for sore throat.  If your COVID-19 test is positive, isolate yourself for five days from today.  At the end of five days you may end isolation if your symptoms have cleared or improved, and you have not had a fever for 24 hours. At this time you should wear a mask for five more days when you are around others.   If symptoms become significantly worse during the night or over the weekend, proceed to the local emergency room.            ED Prescriptions     Medication Sig Dispense Auth. Provider   amoxicillin (AMOXIL) 875 MG tablet Take 1 tablet (875 mg total) by mouth 2 (two) times daily. 20 tablet , MD         , MD 10/28/20 [phone]

## 2020-10-26 NOTE — ED Triage Notes (Signed)
Sore throat, productive cough with green sputum x 7 days. Unvaccinated Had Covid in January

## 2020-10-28 LAB — COVID-19, FLU A+B NAA
Influenza A, NAA: NOT DETECTED
Influenza B, NAA: NOT DETECTED
SARS-CoV-2, NAA: DETECTED — AB

## 2021-01-23 ENCOUNTER — Encounter: Payer: Self-pay | Admitting: Emergency Medicine

## 2021-01-23 ENCOUNTER — Emergency Department (INDEPENDENT_AMBULATORY_CARE_PROVIDER_SITE_OTHER)
Admission: EM | Admit: 2021-01-23 | Discharge: 2021-01-23 | Disposition: A | Discharge status: Home / Self Care | Source: Home / Self Care

## 2021-01-23 DIAGNOSIS — B349 Viral infection, unspecified: Secondary | ICD-10-CM | POA: Diagnosis not present

## 2021-01-23 DIAGNOSIS — J029 Acute pharyngitis, unspecified: Secondary | ICD-10-CM

## 2021-01-23 LAB — POC SARS CORONAVIRUS 2 AG -  ED: SARS Coronavirus 2 Ag: NEGATIVE

## 2021-01-23 LAB — POCT RAPID STREP A (OFFICE): Rapid Strep A Screen: NEGATIVE

## 2021-01-23 MED ORDER — ONDANSETRON 4 MG PO TBDP
4.0000 mg | ORAL_TABLET | Freq: Once | ORAL | Status: AC
  Administered 2021-01-23: 4 mg via ORAL

## 2021-01-23 MED ORDER — ACETAMINOPHEN 325 MG PO TABS
650.0000 mg | ORAL_TABLET | Freq: Once | ORAL | Status: AC
  Administered 2021-01-23: 650 mg via ORAL

## 2021-01-23 NOTE — ED Notes (Signed)
Called patient to come in at 4:25, no answer.

## 2021-01-23 NOTE — ED Provider Notes (Signed)
[facility]    CSN: [id number] Arrival date & time: 01/23/21  1600      History   Chief Complaint Chief Complaint  Patient presents with   Fatigue   Sore Throat    HPI [name] is a 40 y.o. female.   HPI 40 year old female presents with fever, chills, fatigue, sore throat x 3 days.  Patient is unvaccinated for COVID-19.  Past Medical History:  Diagnosis Date   COVID-19 05/2020   Headache    History of kidney stones    History of pyelonephritis    Vitamin B12 deficiency    Vitamin D deficiency     Patient Active Problem List   Diagnosis Date Noted   Status post repeat low transverse cesarean section 08/26/2019   Dyspareunia 03/04/2018   Vaginal ulcer 03/04/2018   Bilateral numbness and tingling of arms and legs 03/18/2016   Migraine with aura and without status migrainosus, not intractable 03/18/2016   Syncope 03/18/2016   Vitamin B12 deficiency 03/04/2016   Vitamin D insufficiency 03/04/2016   Kidney stone 07/14/2015    Past Surgical History:  Procedure Laterality Date   CESAREAN SECTION  2005, 2010   CESAREAN SECTION N/A 08/26/2019   Procedure: CESAREAN SECTION;  Surgeon: Huel Cote, MD;  Location: [facility];  Service: Obstetrics;  Laterality: N/A;  Tracey RNFA   WISDOM TOOTH EXTRACTION      OB History     Gravida  4   Para  3   Term  2   Preterm  1   AB      Living  1      SAB      IAB      Ectopic      Multiple  0   Live Births  1            Home Medications    Prior to Admission medications   Medication Sig Start Date End Date Taking? Authorizing Provider  amoxicillin (AMOXIL) 875 MG tablet Take 1 tablet (875 mg total) by mouth 2 (two) times daily. Patient not taking: Reported on 01/23/2021 10/26/20   Lattie Haw, MD  Cyanocobalamin (VITAMIN B-12) 2500 MCG SUBL Place under the tongue.    [provider]  ibuprofen (ADVIL) 800 MG tablet Take 1 tablet (800 mg total) by mouth every 8 (eight)  hours. Patient not taking: Reported on 01/23/2021 08/28/19   Lavina Hamman, MD  linaclotide [facility]) 145 MCG CAPS capsule Take 1 capsule (145 mcg total) by mouth daily before breakfast. Patient not taking: Reported on 01/23/2021 03/04/18   Sunnie Nielsen, DO  loratadine (CLARITIN) 10 MG tablet Take 10 mg by mouth daily.    [provider]  VITAMIN D PO Take 2,000 Units by mouth.    [provider]  Vitamin D, Ergocalciferol, (DRISDOL) 50000 units CAPS capsule Take 1 capsule (50,000 Units total) by mouth every 7 (seven) days. Take for 8 total doses(weeks) Patient not taking: Reported on 01/23/2021 03/05/18   Sunnie Nielsen, DO    Family History Family History  Problem Relation Age of Onset   Diabetes Mother    Hypertension Mother    Thyroid disease Mother    Diabetes Father    Hypertension Father    Emphysema Father    Heart attack Father    Liver disease Father    Parkinson's disease Father    Breast cancer Maternal Grandmother    Diabetes Maternal Grandmother     Social  History Social History   Tobacco Use   Smoking status: Never   Smokeless tobacco: Never  Vaping Use   Vaping Use: Never used  Substance Use Topics   Alcohol use: No   Drug use: No     Allergies   Codeine and Tamiflu [oseltamivir phosphate]   Review of Systems Review of Systems   Physical Exam Triage Vital Signs ED Triage Vitals  Enc Vitals Group     BP 01/23/21 1729 108/75     Pulse Rate 01/23/21 1729 100     Resp 01/23/21 1729 18     Temp 01/23/21 1729 (!) 102.1 F (38.9 C)     Temp Source 01/23/21 1729 Oral     SpO2 01/23/21 1729 100 %     Weight 01/23/21 1731 165 lb (74.8 kg)     Height 01/23/21 1731 5\' 1"  (1.549 m)     Head Circumference --      Peak Flow --      Pain Score 01/23/21 1731 7     Pain Loc --      Pain Edu? --      Excl. in GC? --    No data found.  Updated Vital Signs BP 108/75 (BP Location: Right Arm)   Pulse 100   Temp (!) 101.5 F  (38.6 C) (Oral)   Resp 18   Ht 5\' 1"  (1.549 m)   Wt 165 lb (74.8 kg)   SpO2 100%   Breastfeeding Yes   BMI 31.18 kg/m    Physical Exam Vitals and nursing note reviewed.  Constitutional:      General: She is not in acute distress.    Appearance: Normal appearance. She is obese. She is ill-appearing.  HENT:     Head: Normocephalic and atraumatic.     Right Ear: Tympanic membrane, ear canal and external ear normal.     Left Ear: Tympanic membrane, ear canal and external ear normal.     Mouth/Throat:     Mouth: Mucous membranes are moist.     Pharynx: Oropharynx is clear. No oropharyngeal exudate or posterior oropharyngeal erythema.  Eyes:     Extraocular Movements: Extraocular movements intact.     Conjunctiva/sclera: Conjunctivae normal.     Pupils: Pupils are equal, round, and reactive to light.  Cardiovascular:     Rate and Rhythm: Normal rate and regular rhythm.     Pulses: Normal pulses.     Heart sounds: Normal heart sounds. No murmur heard. Pulmonary:     Effort: Pulmonary effort is normal.     Breath sounds: Normal breath sounds.     Comments: No adventitious breath sounds noted Musculoskeletal:        General: Normal range of motion.     Cervical back: Normal range of motion and neck supple. No tenderness.  Lymphadenopathy:     Cervical: No cervical adenopathy.  Skin:    General: Skin is warm and dry.  Neurological:     General: No focal deficit present.     Mental Status: She is alert and oriented to person, place, and time. Mental status is at baseline.  Psychiatric:        Mood and Affect: Mood normal.        Behavior: Behavior normal.        Thought Content: Thought content normal.     UC Treatments / Results  Labs (all labs ordered are listed, but only abnormal results are displayed) Labs Reviewed  CULTURE, GROUP  A STREP  COVID-19, FLU A+B NAA  POCT RAPID STREP A (OFFICE)  POC SARS CORONAVIRUS 2 AG -  ED    EKG   Radiology No results  found.  Procedures Procedures (including critical care time)  Medications Ordered in UC Medications  acetaminophen (TYLENOL) tablet 650 mg (650 mg Oral Given 01/23/21 1753)  ondansetron (ZOFRAN-ODT) disintegrating tablet 4 mg (4 mg Oral Given 01/23/21 1752)    Initial Impression / Assessment and Plan / UC Course  I have reviewed the triage vital signs and the nursing notes.  Pertinent labs & imaging results that were available during my care of the patient were reviewed by me and considered in my medical decision making (see chart for details).     MDM: 1. Viral Illness-COVID 19/flu A&B ordered; 2. Fever-Tylenol 650 mg given once in clinic prior to discharge today.  Advised/instructed patient conservative measures for now may alternate OTC Tylenol 1000 mg 1-2 times daily, as needed with OTC Ibuprofen 800 mg 1-2 times daily, as needed for fever and myalgias.  Advised patient we will follow-up with lab results once resulted. Final Clinical Impressions(s) / UC Diagnoses   Final diagnoses:  Acute pharyngitis, unspecified etiology  Viral pharyngitis  Viral illness     Discharge Instructions      Advised/instructed patient conservative measures for now may alternate OTC Tylenol 1000 mg 1-2 times daily, as needed with OTC Ibuprofen 800 mg 1-2 times daily, as needed for fever and myalgias.  Advised patient we will follow-up with lab results once resulted.     ED Prescriptions   None    PDMP not reviewed this encounter.   Trevor Iha, FNP 01/23/21 [phone]

## 2021-01-23 NOTE — ED Triage Notes (Addendum)
Chills, fatigue, sore throat since Saturday  OTC meds Ibuprofen & Tylenol  Nausea today  Tylenol at noon (650mg )  No COVID vaccine COVID 05/2020

## 2021-01-23 NOTE — Discharge Instructions (Addendum)
Advised/instructed patient conservative measures for now may alternate OTC Tylenol 1000 mg 1-2 times daily, as needed with OTC Ibuprofen 800 mg 1-2 times daily, as needed for fever and myalgias.  Advised patient we will follow-up with lab results once resulted.

## 2021-01-26 ENCOUNTER — Emergency Department (INDEPENDENT_AMBULATORY_CARE_PROVIDER_SITE_OTHER)
Admission: RE | Admit: 2021-01-26 | Discharge: 2021-01-26 | Disposition: A | Discharge status: Home / Self Care | Source: Ambulatory Visit

## 2021-01-26 ENCOUNTER — Other Ambulatory Visit: Payer: Self-pay

## 2021-01-26 VITALS — BP 109/74 | HR 73 | Temp 98.3°F | Resp 17

## 2021-01-26 DIAGNOSIS — J040 Acute laryngitis: Secondary | ICD-10-CM | POA: Diagnosis not present

## 2021-01-26 DIAGNOSIS — R059 Cough, unspecified: Secondary | ICD-10-CM

## 2021-01-26 LAB — COVID-19, FLU A+B NAA
Influenza A, NAA: NOT DETECTED
Influenza B, NAA: NOT DETECTED
SARS-CoV-2, NAA: NOT DETECTED

## 2021-01-26 MED ORDER — METHYLPREDNISOLONE SODIUM SUCC 125 MG IJ SOLR
125.0000 mg | Freq: Once | INTRAMUSCULAR | Status: AC
  Administered 2021-01-26: 125 mg via INTRAMUSCULAR

## 2021-01-26 MED ORDER — CEFDINIR 300 MG PO CAPS: 300.0000 mg | ORAL_CAPSULE | Freq: Two times a day (BID) | ORAL | 0 refills | Status: AC

## 2021-01-26 MED ORDER — METHYLPREDNISOLONE 4 MG PO TBPK: ORAL_TABLET | ORAL | 0 refills | Status: AC

## 2021-01-26 NOTE — Discharge Instructions (Addendum)
Advised/instructed patient to take medication as directed with food to completion.  Advised patient to start Medrol Dosepak tomorrow, Saturday, 01/27/2021.  Encouraged patient increase daily water intake while taking these medications.

## 2021-01-26 NOTE — ED Provider Notes (Signed)
[name]    CSN: [id number] Arrival date & time: 01/26/21  1156      History   Chief Complaint Chief Complaint  Patient presents with   Cough    APPT 12pm   Shortness of Breath    From chest congestion    HPI [name] is a 40 y.o. female.   HPI 40 year old female presents with cough and congestion since 01/20/2021.  Patient was evaluated by me 3 days ago on 01/23/2021 for viral pharyngitis and viral illness-please see that encounter note.  COVID 19 flu A&B was negative.  Past Medical History:  Diagnosis Date   COVID-19 05/2020   Headache    History of kidney stones    History of pyelonephritis    Vitamin B12 deficiency    Vitamin D deficiency     Patient Active Problem List   Diagnosis Date Noted   Status post repeat low transverse cesarean section 08/26/2019   Dyspareunia 03/04/2018   Vaginal ulcer 03/04/2018   Bilateral numbness and tingling of arms and legs 03/18/2016   Migraine with aura and without status migrainosus, not intractable 03/18/2016   Syncope 03/18/2016   Vitamin B12 deficiency 03/04/2016   Vitamin D insufficiency 03/04/2016   Kidney stone 07/14/2015    Past Surgical History:  Procedure Laterality Date   CESAREAN SECTION  2005, 2010   CESAREAN SECTION N/A 08/26/2019   Procedure: CESAREAN SECTION;  Surgeon: Huel Cote, MD;  Location: [facility] LD ORS;  Service: Obstetrics;  Laterality: N/A;  Tracey RNFA   WISDOM TOOTH EXTRACTION      OB History     Gravida  4   Para  3   Term  2   Preterm  1   AB      Living  1      SAB      IAB      Ectopic      Multiple  0   Live Births  1            Home Medications    Prior to Admission medications   Medication Sig Start Date End Date Taking? Authorizing Provider  cefdinir (OMNICEF) 300 MG capsule Take 1 capsule (300 mg total) by mouth 2 (two) times daily for 7 days. 01/26/21 02/02/21 Yes Trevor Iha, FNP  methylPREDNISolone (MEDROL DOSEPAK) 4 MG TBPK  tablet Take as directed. 01/26/21  Yes Trevor Iha, FNP  amoxicillin (AMOXIL) 875 MG tablet Take 1 tablet (875 mg total) by mouth 2 (two) times daily. Patient not taking: Reported on 01/23/2021 10/26/20   Lattie Haw, MD  Cyanocobalamin (VITAMIN B-12) 2500 MCG SUBL Place under the tongue.    [provider]  ibuprofen (ADVIL) 800 MG tablet Take 1 tablet (800 mg total) by mouth every 8 (eight) hours. Patient not taking: Reported on 01/23/2021 08/28/19   Lavina Hamman, MD  linaclotide [facility]) 145 MCG CAPS capsule Take 1 capsule (145 mcg total) by mouth daily before breakfast. Patient not taking: Reported on 01/23/2021 03/04/18   Sunnie Nielsen, DO  loratadine (CLARITIN) 10 MG tablet Take 10 mg by mouth daily.    [provider]  VITAMIN D PO Take 2,000 Units by mouth.    [provider]  Vitamin D, Ergocalciferol, (DRISDOL) 50000 units CAPS capsule Take 1 capsule (50,000 Units total) by mouth every 7 (seven) days. Take for 8 total doses(weeks) Patient not taking: Reported on 01/23/2021 03/05/18   Sunnie Nielsen, DO    Family  History Family History  Problem Relation Age of Onset   Diabetes Mother    Hypertension Mother    Thyroid disease Mother    Diabetes Father    Hypertension Father    Emphysema Father    Heart attack Father    Liver disease Father    Parkinson's disease Father    Breast cancer Maternal Grandmother    Diabetes Maternal Grandmother     Social History Social History   Tobacco Use   Smoking status: Never   Smokeless tobacco: Never  Vaping Use   Vaping Use: Never used  Substance Use Topics   Alcohol use: No   Drug use: No     Allergies   Codeine and Tamiflu [oseltamivir phosphate]   Review of Systems Review of Systems  HENT:  Positive for congestion, trouble swallowing and voice change.   Respiratory:  Positive for cough.   All other systems reviewed and are negative.   Physical Exam Triage Vital Signs ED  Triage Vitals  Enc Vitals Group     BP 01/26/21 1245 109/74     Pulse Rate 01/26/21 1245 73     Resp 01/26/21 1245 17     Temp 01/26/21 1245 98.3 F (36.8 C)     Temp Source 01/26/21 1245 Oral     SpO2 01/26/21 1245 99 %     Weight --      Height --      Head Circumference --      Peak Flow --      Pain Score 01/26/21 1223 0     Pain Loc --      Pain Edu? --      Excl. in GC? --    No data found.  Updated Vital Signs BP 109/74 (BP Location: Right Arm)   Pulse 73   Temp 98.3 F (36.8 C) (Oral)   Resp 17   SpO2 99%    Physical Exam Vitals and nursing note reviewed.  Constitutional:      General: She is not in acute distress.    Appearance: Normal appearance. She is obese. She is not ill-appearing.  HENT:     Head: Normocephalic and atraumatic.     Right Ear: Tympanic membrane, ear canal and external ear normal.     Left Ear: Tympanic membrane, ear canal and external ear normal.     Mouth/Throat:     Mouth: Mucous membranes are moist.     Pharynx: Oropharynx is clear.     Comments: Loss of voice for the past 5 days Eyes:     Extraocular Movements: Extraocular movements intact.     Conjunctiva/sclera: Conjunctivae normal.     Pupils: Pupils are equal, round, and reactive to light.  Cardiovascular:     Rate and Rhythm: Normal rate and regular rhythm.     Pulses: Normal pulses.     Heart sounds: Normal heart sounds.  Pulmonary:     Effort: Pulmonary effort is normal.     Breath sounds: Rhonchi present. No wheezing or rales.     Comments: Mild diffuse scattered rhonchi noted throughout, frequent nonproductive cough noted on exam Musculoskeletal:        General: Normal range of motion.     Cervical back: Normal range of motion and neck supple. Tenderness present.  Lymphadenopathy:     Cervical: Cervical adenopathy present.  Skin:    General: Skin is warm and dry.  Neurological:     General: No focal deficit present.  Mental Status: She is alert and oriented  to person, place, and time. Mental status is at baseline.  Psychiatric:        Mood and Affect: Mood normal.        Behavior: Behavior normal.        Thought Content: Thought content normal.     UC Treatments / Results  Labs (all labs ordered are listed, but only abnormal results are displayed) Labs Reviewed - No data to display  EKG   Radiology No results found.  Procedures Procedures (including critical care time)  Medications Ordered in UC Medications  methylPREDNISolone sodium succinate (SOLU-MEDROL) 125 mg/2 mL injection 125 mg (125 mg Intramuscular Given 01/26/21 1342)    Initial Impression / Assessment and Plan / UC Course  I have reviewed the triage vital signs and the nursing notes.  Pertinent labs & imaging results that were available during my care of the patient were reviewed by me and considered in my medical decision making (see chart for details).     MDM: 1.  Cough-IM Solu-Medrol 125 given once in clinic today prior to discharge, Rx'd Cefdinir; 2.  Laryngitis-Rx'd Cefdinir and Medrol Dosepak. Advised/instructed patient to take medication as directed with food to completion.  Advised patient to start Medrol Dosepak tomorrow, Saturday, 01/27/2021.  Encouraged patient increase daily water intake while taking these medications.  Patient discharged home, hemodynamically stable. Final Clinical Impressions(s) / UC Diagnoses   Final diagnoses:  Cough  Laryngitis     Discharge Instructions      Advised/instructed patient to take medication as directed with food to completion.  Advised patient to start Medrol Dosepak tomorrow, Saturday, 01/27/2021.  Encouraged patient increase daily water intake while taking these medications.     ED Prescriptions     Medication Sig Dispense Auth. Provider   cefdinir (OMNICEF) 300 MG capsule Take 1 capsule (300 mg total) by mouth 2 (two) times daily for 7 days. 14 capsule Trevor Iha, FNP   methylPREDNISolone (MEDROL  DOSEPAK) 4 MG TBPK tablet Take as directed. 1 each Trevor Iha, FNP      PDMP not reviewed this encounter.   Trevor Iha, FNP 01/26/21 1349

## 2021-01-26 NOTE — ED Triage Notes (Signed)
Pt c/o cough and congestion since 9/17. Was seen in UC 9/20, covid test neg. Taking otc meds with little relief. Other family mbrs sick, and have been tx with antibiotics and feeling better. Hx of bronchitis.

## 2021-01-29 LAB — CULTURE, GROUP A STREP: Strep A Culture: NEGATIVE
